# Patient Record
Sex: Female | Born: 1970 | Race: White | Hispanic: No | Smoking: Never smoker
Health system: Southern US, Community
[De-identification: ages and names within clinical notes are randomized; demographics above are authoritative.]

## PROBLEM LIST (undated history)

## (undated) DIAGNOSIS — Z9889 Other specified postprocedural states: Secondary | ICD-10-CM

## (undated) DIAGNOSIS — R112 Nausea with vomiting, unspecified: Secondary | ICD-10-CM

## (undated) DIAGNOSIS — M1990 Unspecified osteoarthritis, unspecified site: Secondary | ICD-10-CM

## (undated) HISTORY — PX: ABDOMINAL HYSTERECTOMY: SHX81

---

## 2008-03-19 HISTORY — PX: EYE SURGERY: SHX253

## 2012-11-12 ENCOUNTER — Encounter: Payer: Self-pay | Admitting: Family Medicine

## 2012-11-12 ENCOUNTER — Ambulatory Visit (INDEPENDENT_AMBULATORY_CARE_PROVIDER_SITE_OTHER): Payer: Managed Care, Other (non HMO) | Admitting: Family Medicine

## 2012-11-12 VITALS — BP 110/68 | Ht 65.0 in | Wt 133.8 lb

## 2012-11-12 DIAGNOSIS — Z0000 Encounter for general adult medical examination without abnormal findings: Secondary | ICD-10-CM

## 2012-11-12 DIAGNOSIS — E785 Hyperlipidemia, unspecified: Secondary | ICD-10-CM | POA: Insufficient documentation

## 2012-11-12 DIAGNOSIS — F411 Generalized anxiety disorder: Secondary | ICD-10-CM

## 2012-11-12 NOTE — Addendum Note (Signed)
Addended by: Donna Bernard on: 11/12/2012 02:26 PM   Modules accepted: Level of Service

## 2012-11-12 NOTE — Progress Notes (Signed)
  Subjective:    Patient ID: [name], female    DOB: 1970/11/28, 42 y.o.   MRN: [id number]  HPI Patient arrives needing blood  Was on nxiety med--lexapro--and got off it. Claims no overt depression.  Will develop rapid heart rate. Usually associated with anxiety.  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 No current problems or concerns. History of mild elevation of cholesterol, however HDL is good. Admits that her diet is not the best.  Review of Systems ROS otherwise negative    Objective:   Physical Exam Alert no acute distress. HEENT normal. Lungs clear. Heart regular rate and rhythm. Abdomen benign.       Assessment & Plan:  Impression 1 tachyarrhythmia associated with anxiety no need for major workup at this time discussed. #2 hyperlipidemia. Plan diet exercise discussed. Appropriate blood work patient needs ASAP in order to qualify for insurance discount. WSL

## 2012-11-13 LAB — LIPID PANEL
Cholesterol: 200 mg/dL (ref 0–200)
HDL: 56 mg/dL (ref >39)
Total CHOL/HDL Ratio: 3.6 Ratio

## 2012-11-13 LAB — GLUCOSE, RANDOM: Glucose, Bld: 83 mg/dL (ref 70–99)

## 2015-07-27 ENCOUNTER — Ambulatory Visit (INDEPENDENT_AMBULATORY_CARE_PROVIDER_SITE_OTHER): Payer: Managed Care, Other (non HMO) | Admitting: Family Medicine

## 2015-07-27 ENCOUNTER — Encounter: Payer: Self-pay | Admitting: Family Medicine

## 2015-07-27 VITALS — BP 108/74 | Ht 65.0 in | Wt 131.0 lb

## 2015-07-27 DIAGNOSIS — E785 Hyperlipidemia, unspecified: Secondary | ICD-10-CM

## 2015-07-27 DIAGNOSIS — Z1231 Encounter for screening mammogram for malignant neoplasm of breast: Secondary | ICD-10-CM

## 2015-07-27 DIAGNOSIS — M25551 Pain in right hip: Secondary | ICD-10-CM | POA: Diagnosis not present

## 2015-07-27 DIAGNOSIS — Z139 Encounter for screening, unspecified: Secondary | ICD-10-CM

## 2015-07-27 MED ORDER — ETODOLAC 400 MG PO TABS: 400.0000 mg | ORAL_TABLET | Freq: Two times a day (BID) | ORAL | Status: DC

## 2015-07-27 NOTE — Progress Notes (Signed)
   Subjective:    Patient ID: Theresa Nelson, female    DOB: 07/22/1970, 45 y.o.   MRN: [id number] Patient arrives after a several year hiatus with multiple Hyperlipidemia This is a chronic problem. The current episode started more than 1 year ago. Compliance problems include adherence to exercise and adherence to diet.   exercising some, having somde trouble with the food, walking all day,   Requesting bloodwork and screening mammo. Pt states she has never had a mammogram. No fam hx of breast cancer  Pos fam hx of prost ca,  Some diab in the family, mo has high b p  Some    Right hip pain. Started a few months ago. Pain radiates down right leg. Not able to sleep well due to pain. Worse when sitting. Takes aleve or advil as needed. Feels a catch in it at times, last six to eight months , catching sensation, worse with sitting  Not a big veggie person , some fruits  Goes to dr Despina [facility], couple yrs ago, so on a protracted visit plan   Rapid heart rate spells occas, last five or ten mnutes, feels like anxiety and jittery . No major chest discomfort with nausea no diaphoresis   Review of Systems No headache, no major weight loss or weight gain, no chest pain no back pain abdominal pain no change in bowel habits complete ROS otherwise negative     Objective:   Physical Exam  Alert vitals stable blood pressure excellent on repeat HEENT normal lungs clear. Heart regular rhythm no tachyarrhythmias auscultated right hip positive pain with internal rotation negative true straight leg raise. Mild tenderness lateral trochanter with palpation No obvious side effects sugars no spinal tenderness      Assessment & Plan:  Impression 1 subacute hip pain exam points more towards something outside joint like bursitis, doubt sciatica" though always possibility #2 hyperlipidemia status uncertain 3 tachyarrhythmias ongoing not to 4  Proper screening was never had a mammogram and 25 minutes spent most in  discussion. Trial of anti-inflammatory. Hip x-ray. Appropriate blood work. Diet exercise discussed mammogram scheduled

## 2015-07-28 ENCOUNTER — Ambulatory Visit (HOSPITAL_COMMUNITY): Payer: Managed Care, Other (non HMO)

## 2015-07-28 ENCOUNTER — Ambulatory Visit (HOSPITAL_COMMUNITY)
Admission: RE | Admit: 2015-07-28 | Discharge: 2015-07-28 | Disposition: A | Discharge status: Home / Self Care | Payer: Managed Care, Other (non HMO) | Source: Ambulatory Visit | Attending: Family Medicine | Admitting: Family Medicine

## 2015-07-28 ENCOUNTER — Other Ambulatory Visit: Payer: Self-pay | Admitting: Family Medicine

## 2015-07-28 DIAGNOSIS — R928 Other abnormal and inconclusive findings on diagnostic imaging of breast: Secondary | ICD-10-CM | POA: Insufficient documentation

## 2015-07-28 DIAGNOSIS — Z1231 Encounter for screening mammogram for malignant neoplasm of breast: Secondary | ICD-10-CM | POA: Diagnosis not present

## 2015-07-28 DIAGNOSIS — M1611 Unilateral primary osteoarthritis, right hip: Secondary | ICD-10-CM | POA: Diagnosis not present

## 2015-07-28 DIAGNOSIS — M25551 Pain in right hip: Secondary | ICD-10-CM | POA: Insufficient documentation

## 2015-07-28 LAB — HEPATIC FUNCTION PANEL
ALT: 10 IU/L (ref 0–32)
AST: 13 IU/L (ref 0–40)
Albumin: 4.8 g/dL (ref 3.5–5.5)
Alkaline Phosphatase: 79 IU/L (ref 39–117)
Bilirubin Total: 0.4 mg/dL (ref 0.0–1.2)
Bilirubin, Direct: 0.09 mg/dL (ref 0.00–0.40)
Total Protein: 7.5 g/dL (ref 6.0–8.5)

## 2015-07-28 LAB — LIPID PANEL
CHOL/HDL RATIO: 3.9 ratio (ref 0.0–4.4)
Cholesterol, Total: 221 mg/dL — ABNORMAL HIGH (ref 100–199)
HDL: 56 mg/dL (ref >39)
LDL CALC: 128 mg/dL — AB (ref 0–99)
TRIGLYCERIDES: 186 mg/dL — AB (ref 0–149)
VLDL CHOLESTEROL CAL: 37 mg/dL (ref 5–40)

## 2015-07-28 LAB — BASIC METABOLIC PANEL
BUN/Creatinine Ratio: 20 (ref 9–23)
BUN: 13 mg/dL (ref 6–24)
CO2: 23 mmol/L (ref 18–29)
CREATININE: 0.64 mg/dL (ref 0.57–1.00)
Calcium: 9.5 mg/dL (ref 8.7–10.2)
Chloride: 100 mmol/L (ref 96–106)
GFR calc Af Amer: 125 mL/min/{1.73_m2} (ref >59)
GFR calc non Af Amer: 108 mL/min/{1.73_m2} (ref >59)
GLUCOSE: 94 mg/dL (ref 65–99)
POTASSIUM: 4.5 mmol/L (ref 3.5–5.2)
SODIUM: 141 mmol/L (ref 134–144)

## 2015-07-29 NOTE — Addendum Note (Signed)
Addended by: Jeralene PetersREWS, SHANNON R on: 07/29/2015 09:17 AM   Modules accepted: Orders

## 2015-08-01 ENCOUNTER — Other Ambulatory Visit: Payer: Self-pay | Admitting: Family Medicine

## 2015-08-01 DIAGNOSIS — R928 Other abnormal and inconclusive findings on diagnostic imaging of breast: Secondary | ICD-10-CM

## 2015-08-08 ENCOUNTER — Other Ambulatory Visit (HOSPITAL_COMMUNITY): Payer: Self-pay | Admitting: Sports Medicine

## 2015-08-16 ENCOUNTER — Encounter (HOSPITAL_COMMUNITY): Payer: Managed Care, Other (non HMO)

## 2015-08-16 ENCOUNTER — Ambulatory Visit (HOSPITAL_COMMUNITY)
Admission: RE | Admit: 2015-08-16 | Discharge: 2015-08-16 | Disposition: A | Discharge status: Home / Self Care | Payer: Managed Care, Other (non HMO) | Source: Ambulatory Visit | Attending: Family Medicine | Admitting: Family Medicine

## 2015-08-16 ENCOUNTER — Other Ambulatory Visit: Payer: Self-pay | Admitting: Family Medicine

## 2015-08-16 DIAGNOSIS — R928 Other abnormal and inconclusive findings on diagnostic imaging of breast: Secondary | ICD-10-CM | POA: Insufficient documentation

## 2017-02-13 IMAGING — DX DG HIP (WITH OR WITHOUT PELVIS) 2-3V*R*
3 series · 3 of 3 positions shown · non-contrast
Comparison: None.

CLINICAL DATA: Right hip pain radiating to the knee, no injury

EXAM:
DG HIP (WITH OR WITHOUT PELVIS) 2-3V RIGHT

[pelvis ap]
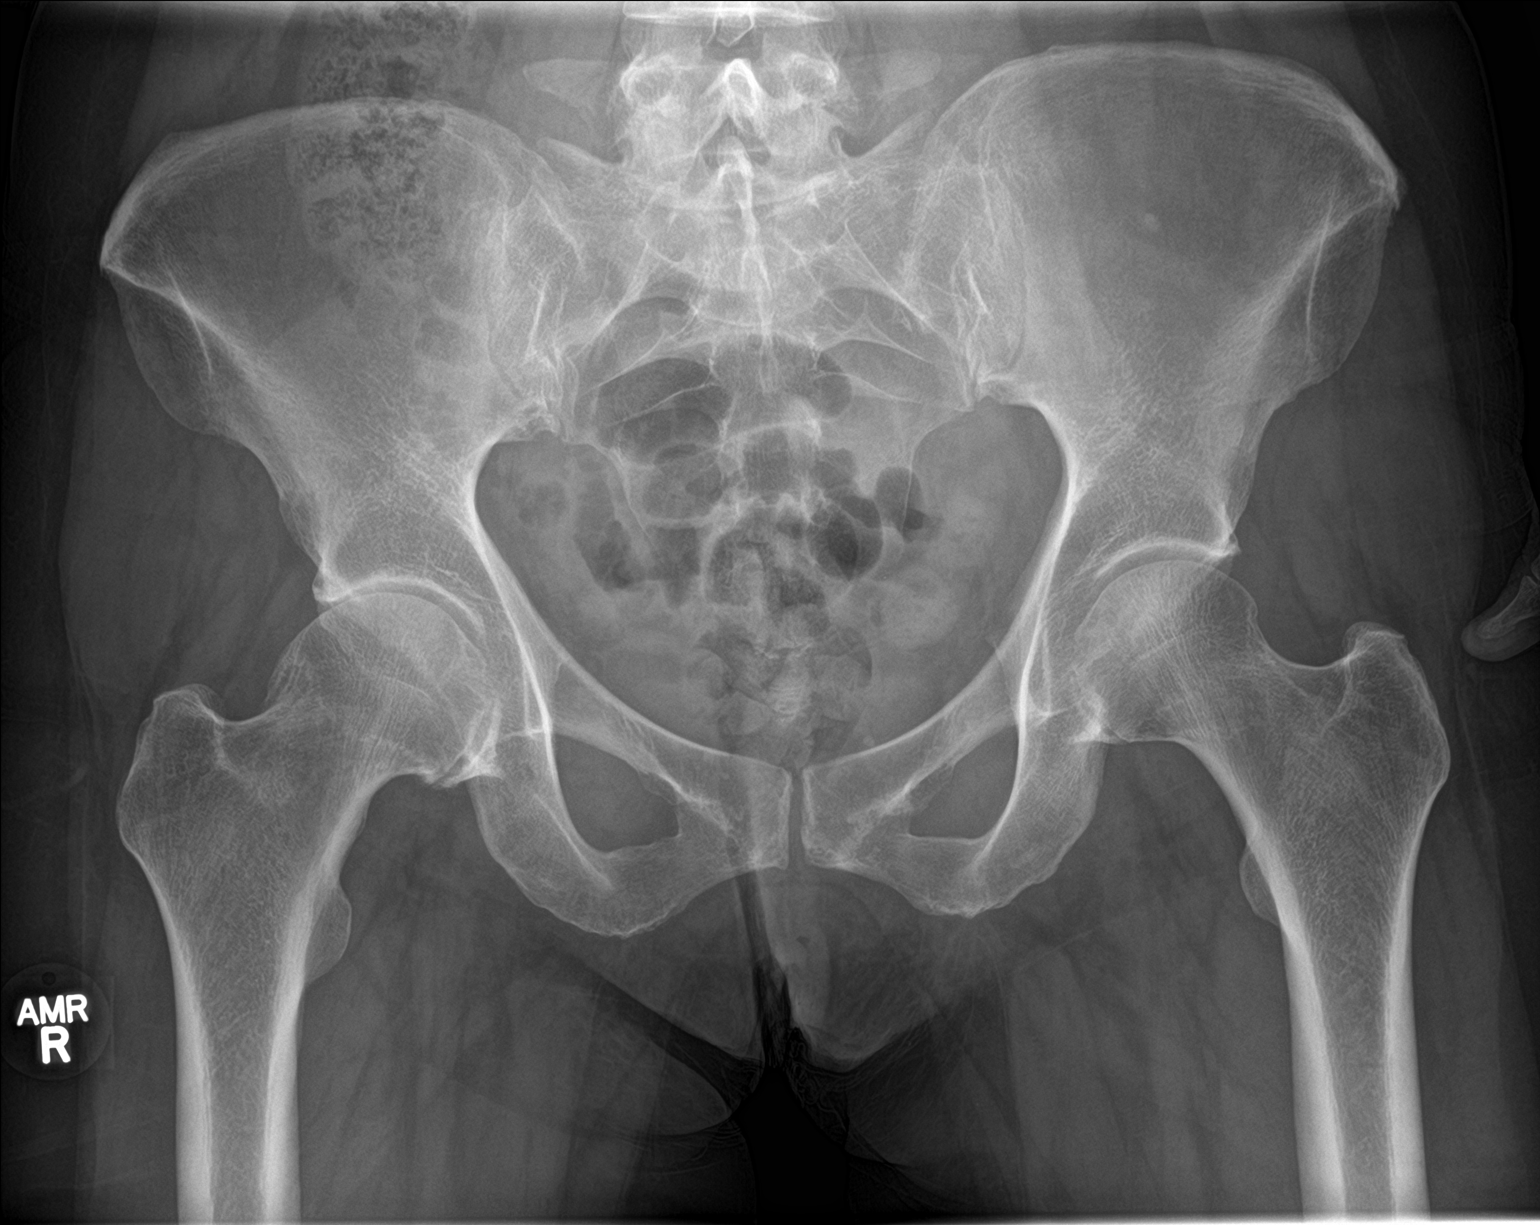

[hip ap]
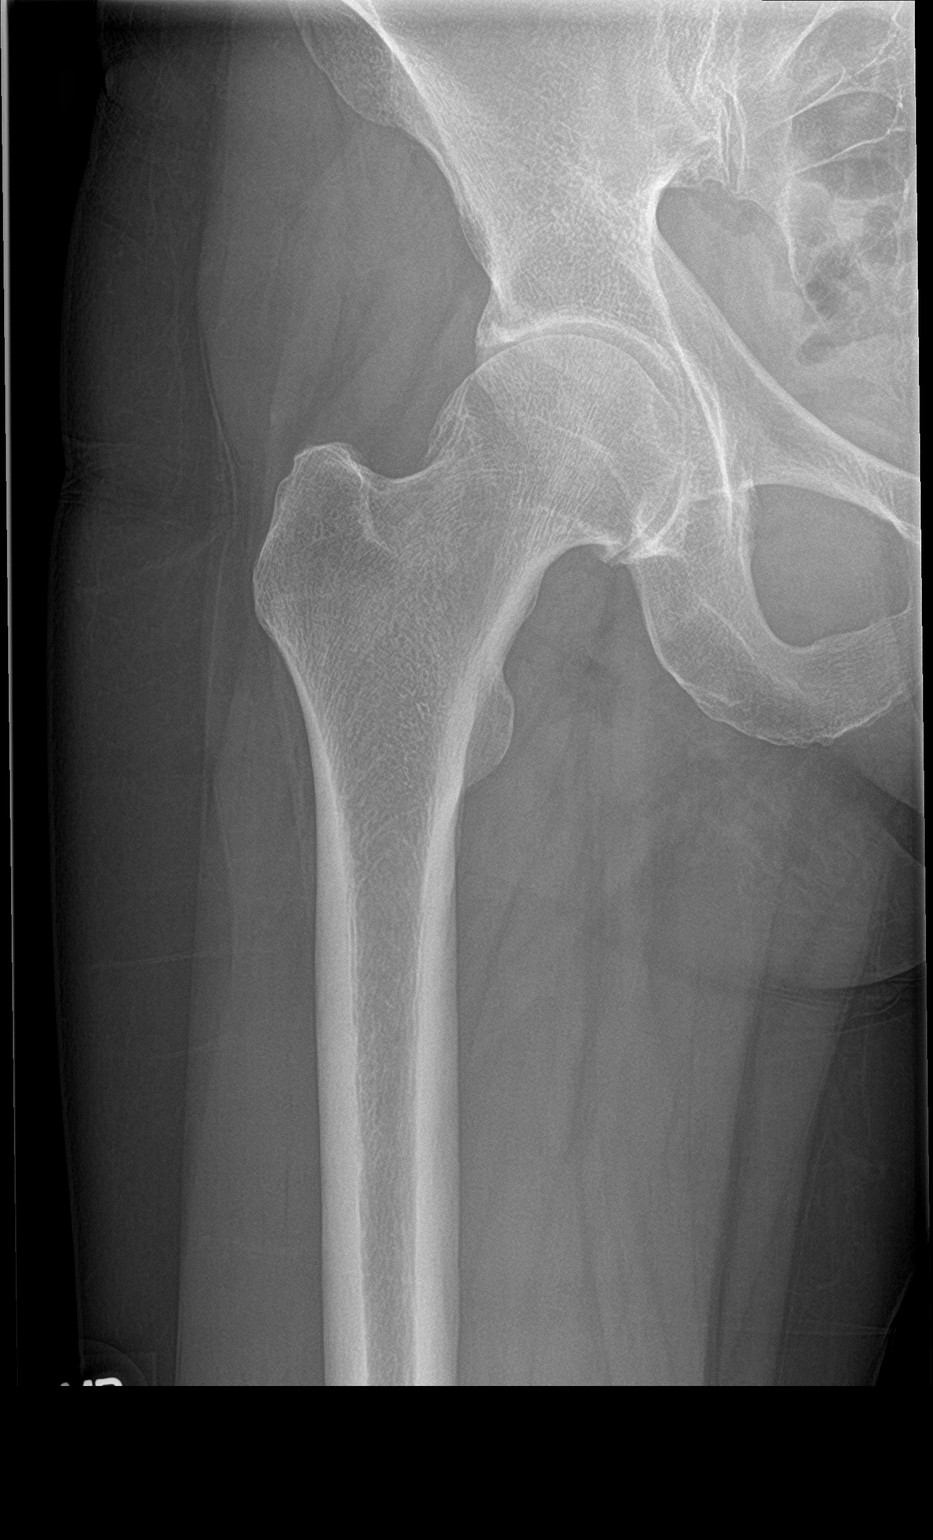

[hip lat]
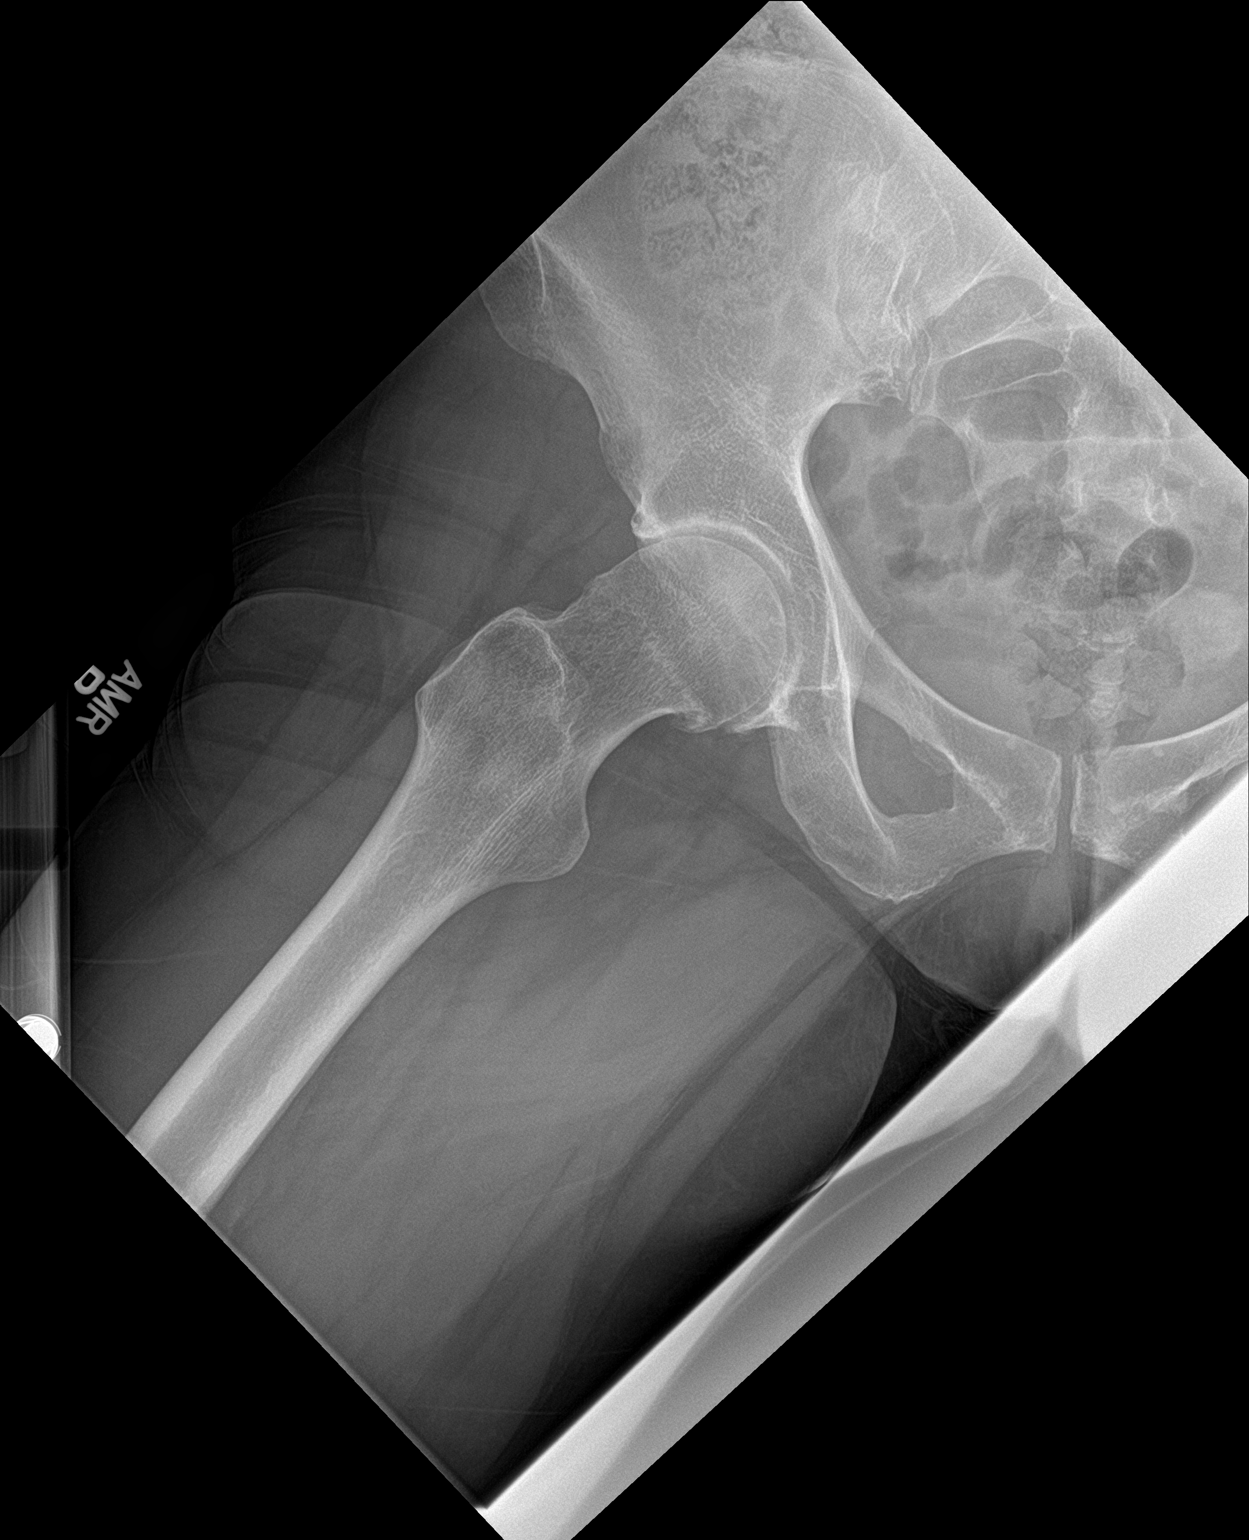

[3 of 3 positions shown; findings below may reference images not displayed]

FINDINGS: There is age advanced degenerative joint disease primarily involving
the right hip where there is more loss of joint space and sclerosis
with spurring. Lesser degenerative change is noted in the left hip.
The pelvic rami are intact. The SI joints are corticated.
IMPRESSION: Age advanced moderate degenerative joint disease of the right hip.

## 2017-05-31 ENCOUNTER — Ambulatory Visit (INDEPENDENT_AMBULATORY_CARE_PROVIDER_SITE_OTHER): Payer: BLUE CROSS/BLUE SHIELD | Admitting: Family Medicine

## 2017-05-31 VITALS — BP 112/72 | Wt 132.6 lb

## 2017-05-31 DIAGNOSIS — R300 Dysuria: Secondary | ICD-10-CM

## 2017-05-31 DIAGNOSIS — M545 Low back pain, unspecified: Secondary | ICD-10-CM

## 2017-05-31 LAB — POCT URINALYSIS DIPSTICK
Spec Grav, UA: 1.015 (ref 1.010–1.025)
pH, UA: 6 (ref 5.0–8.0)

## 2017-05-31 MED ORDER — DICLOFENAC SODIUM 75 MG PO TBEC: 75.0000 mg | DELAYED_RELEASE_TABLET | Freq: Two times a day (BID) | ORAL | 0 refills | Status: DC

## 2017-05-31 NOTE — Progress Notes (Signed)
   Subjective:    Patient ID: Theresa Nelson, female    DOB: 11-01-1970, 47 y.o.   MRN: [id number]  HPI Patient arrives with back pain on her left side for a few days.  She describes this as left lower back pain discomfort is constant is made it difficult for her to sleep at night to get a comfortable position it does not radiate down the leg she states she [place] walking she has arthritis in her right hip she also relates that when she was driving she noticed that as well she denies fever sweats chills weight loss.  She denies abdominal pain dysuria urinary frequency denies flank pain denies hematuria denies rectal bleeding.  States appetite is been good denies being depressed. On physical exam left lower back moderate tenderness increased pain with extension and with deep flexion. Patient would also like to be checked for sinus infection. Patient relates head congestion drainage over the past couple days no wheezing or difficulty breathing Review of Systems  Constitutional: Negative for activity change and appetite change.  HENT: Negative for congestion.   Respiratory: Negative for cough.   Cardiovascular: Negative for chest pain.  Gastrointestinal: Negative for abdominal pain and vomiting.  Skin: Negative for color change.  Neurological: Negative for weakness.  Psychiatric/Behavioral: Negative for confusion.       Objective:   Physical Exam  Constitutional: She appears well-developed and well-nourished. No distress.  HENT:  Head: Normocephalic and atraumatic.  Eyes: Right eye exhibits no discharge. Left eye exhibits no discharge.  Neck: No tracheal deviation present.  Cardiovascular: Normal rate, regular rhythm and normal heart sounds.  No murmur heard. Pulmonary/Chest: Effort normal and breath sounds normal. No respiratory distress. She has no wheezes. She has no rales.  Musculoskeletal: She exhibits no edema.  Lymphadenopathy:    She  has no cervical adenopathy.  Neurological: She is alert. She exhibits normal muscle tone.  Skin: Skin is warm and dry. No erythema.  Psychiatric: Her behavior is normal.  Vitals reviewed.  Negative straight leg raise strength overall good.  Abdomen is soft there is no guarding rebound or tenderness no flank discomfort to percussion urinalysis negative       Assessment & Plan:  Probable sacroiliac joint inflammation.  Concerning for the possibility of underlying issue if this persists.  I recommend anti-inflammatory over the course of the next 10 days then as needed thereafter also recommend stretching exercises printout was given to the patient  If the patient does not have significant improvement over the course of the next 10 days she needs to let us know we will help set her up for x-rays of this area and possible orthopedic consultation.  Certainly with this patient's history of arthritis in the right hip there is some possibility of underlying arthritic issue causing joint dysfunction I find no evidence of any other type of intra-abdominal pathology going on  Viral sinus illness going on should gradually get better over the next several days if not call us back for potential medications

## 2017-12-27 ENCOUNTER — Ambulatory Visit (INDEPENDENT_AMBULATORY_CARE_PROVIDER_SITE_OTHER): Payer: BLUE CROSS/BLUE SHIELD | Admitting: Family Medicine

## 2017-12-27 ENCOUNTER — Encounter: Payer: Self-pay | Admitting: Family Medicine

## 2017-12-27 VITALS — BP 110/72 | Temp 98.2°F | Ht 65.0 in | Wt 132.6 lb

## 2017-12-27 DIAGNOSIS — J329 Chronic sinusitis, unspecified: Secondary | ICD-10-CM

## 2017-12-27 MED ORDER — CEFPROZIL 500 MG PO TABS: 500.0000 mg | ORAL_TABLET | Freq: Two times a day (BID) | ORAL | 0 refills | Status: DC

## 2017-12-27 MED ORDER — DICLOFENAC SODIUM 75 MG PO TBEC: 75.0000 mg | DELAYED_RELEASE_TABLET | Freq: Two times a day (BID) | ORAL | 1 refills | Status: DC

## 2017-12-27 NOTE — Progress Notes (Signed)
   Subjective:    Patient ID: Theresa Nelson, female    DOB: 1970/03/28, 47 y.o.   MRN: [id number]  Cough  This is a new problem. The current episode started in the past 7 days. Associated symptoms include headaches, nasal congestion, a sore throat and wheezing. She has tried OTC cough suppressant for the symptoms.   Positive pain with cooughing    Some productive cough  Dim energy   Tickle and croupy cough    'no fever    Frontal headache  No nose running '  Os wheeziness in the chest at times, worse withdeep breath        Review of Systems  HENT: Positive for sore throat.   Respiratory: Positive for cough and wheezing.   Neurological: Positive for headaches.       Objective:   Physical Exam  Alert, mild malaise. Hydration good Vitals stable. frontal/ maxillary tenderness evident positive nasal congestion. pharynx normal neck supple  lungs clear/no crackles or wheezes. heart regular in rhythm       Assessment & Plan:  Impression rhinosinusitis/bronchitis likely post viral, discussed with patient. plan antibiotics prescribed. Questions answered. Symptomatic care discussed. warning signs discussed. WSL

## 2018-05-20 ENCOUNTER — Other Ambulatory Visit: Payer: Self-pay | Admitting: Family Medicine

## 2018-06-16 ENCOUNTER — Other Ambulatory Visit: Payer: Self-pay | Admitting: Family Medicine

## 2018-10-05 ENCOUNTER — Other Ambulatory Visit: Payer: Self-pay | Admitting: Family Medicine

## 2019-01-19 ENCOUNTER — Other Ambulatory Visit: Payer: Self-pay | Admitting: Family Medicine

## 2019-02-17 ENCOUNTER — Other Ambulatory Visit: Payer: Self-pay

## 2019-02-17 ENCOUNTER — Ambulatory Visit (INDEPENDENT_AMBULATORY_CARE_PROVIDER_SITE_OTHER): Payer: BC Managed Care – PPO | Admitting: Family Medicine

## 2019-02-17 DIAGNOSIS — Z20828 Contact with and (suspected) exposure to other viral communicable diseases: Secondary | ICD-10-CM

## 2019-02-17 DIAGNOSIS — Z20822 Contact with and (suspected) exposure to covid-19: Secondary | ICD-10-CM

## 2019-02-17 MED ORDER — ONDANSETRON 4 MG PO TBDP: 4.0000 mg | ORAL_TABLET | Freq: Three times a day (TID) | ORAL | 0 refills | Status: DC | PRN

## 2019-02-17 NOTE — Progress Notes (Signed)
   Subjective:  Audio plus video  Patient ID: Theresa Nelson, female    DOB: 19-Nov-1970, 48 y.o.   MRN: [id number]  Cough This is a new problem. The current episode started today. Associated symptoms include headaches.   Patient states she had rapid covid test this am and it came back negative but her fiance tested this am as well and his was positive. Patient states her son in laws test came back positive as well and she kept her grandchild this weekend and was exposed to them.   Review of Systems  Respiratory: Positive for cough.   Neurological: Positive for headaches.   Virtual Visit via Video Note  I connected with Theresa Nelson on 02/17/19 at  3:50 PM EST by a video enabled telemedicine application and verified that I am speaking with the correct person using two identifiers.  Location: Patient: home Provider: office   I discussed the limitations of evaluation and management by telemedicine and the availability of in person appointments. The patient expressed understanding and agreed to proceed.  History of Present Illness:    Observations/Objective:   Assessment and Plan:   Follow Up Instructions:    I discussed the assessment and treatment plan with the patient. The patient was provided an opportunity to ask questions and all were answered. The patient agreed with the plan and demonstrated an understanding of the instructions.   The patient was advised to call back or seek an in-person evaluation if the symptoms worsen or if the condition fails to improve as anticipated.  I provided 17 minutes of non-face-to-face time during this encounter.  Persistent symptoms.  Of note boyfriend tested positive.  For COVID-19.  Patient tested negative.  Cough is productive at times.  Some nasal discharge.  Patient had a test of brain which was negative      Objective:   Physical Exam  Virtual      Assessment & Plan:  Impression probable COVID-19 and section.  Will cover  with antibiotics for secondary bacterial involvement.  Negative test should not be trusted in this situation of close contact to known individual discussion held

## 2019-02-18 ENCOUNTER — Encounter: Payer: Self-pay | Admitting: Family Medicine

## 2019-02-18 ENCOUNTER — Other Ambulatory Visit: Payer: Self-pay | Admitting: *Deleted

## 2019-02-18 ENCOUNTER — Telehealth: Payer: Self-pay | Admitting: Family Medicine

## 2019-02-18 MED ORDER — CEFDINIR 300 MG PO CAPS: 300.0000 mg | ORAL_CAPSULE | Freq: Two times a day (BID) | ORAL | 0 refills | Status: DC

## 2019-02-18 NOTE — Telephone Encounter (Signed)
Pt is having constant left ear throbbing. Tylenol is not helping she is wondering if she could have an ear infection.   [id number] [place], [place] had visit on 02/17/2019

## 2019-02-18 NOTE — Telephone Encounter (Signed)
Had visit yesterday 

## 2019-02-18 NOTE — Telephone Encounter (Signed)
I advised pt yest she almost certainly has covid, could hhave seconary ear infxn omnicef 300 bid ten d

## 2019-02-18 NOTE — Telephone Encounter (Signed)
Med sent to pharm. Left message to return call to discuss message below 

## 2019-02-18 NOTE — Telephone Encounter (Signed)
Left ear pain since last week. Took dayquil and it went away and started back last night. Throbbing pain. Tylenol did help today but not yesterday. Also took a sinus pill. Started having a dry cough yesterday. Nose has felt dry for the past couple of days. 2 days ago woke with a headache and a runny nose. covid test came back negative and she is in quarantine because some of her family members have covid.

## 2019-02-18 NOTE — Telephone Encounter (Signed)
Discussed with pt. Pt verbalized understanding.  °

## 2019-02-22 ENCOUNTER — Encounter: Payer: Self-pay | Admitting: Family Medicine

## 2019-03-02 ENCOUNTER — Ambulatory Visit (INDEPENDENT_AMBULATORY_CARE_PROVIDER_SITE_OTHER): Payer: BC Managed Care – PPO | Admitting: Family Medicine

## 2019-03-02 ENCOUNTER — Other Ambulatory Visit: Payer: Self-pay

## 2019-03-02 DIAGNOSIS — Z20822 Contact with and (suspected) exposure to covid-19: Secondary | ICD-10-CM

## 2019-03-02 DIAGNOSIS — Z20828 Contact with and (suspected) exposure to other viral communicable diseases: Secondary | ICD-10-CM | POA: Diagnosis not present

## 2019-03-02 NOTE — Progress Notes (Signed)
   Subjective:    Patient ID: Theresa Nelson, female    DOB: January 09, 1971, 48 y.o.   MRN: [id number]  [place] having headache, sinus issues, ear pain, nausea and bodyaches. covid test was negative but pt states everybody in family was positive for covid. Pt states she felt better but yesterday started having burning in chest and this morning woke up feeling a tightness in her chest. No cough, no sob, no fever.   Virtual Visit via Video Note  I connected with Theresa Nelson on 03/02/19 at  9:30 AM EST by a video enabled telemedicine application and verified that I am speaking with the correct person using two identifiers.  Location: Patient: home Provider: office   I discussed the limitations of evaluation and management by telemedicine and the availability of in person appointments. The patient expressed understanding and agreed to proceed.  History of Present Illness:    Observations/Objective:   Assessment and Plan:   Follow Up Instructions:    I discussed the assessment and treatment plan with the patient. The patient was provided an opportunity to ask questions and all were answered. The patient agreed with the plan and demonstrated an understanding of the instructions.   The patient was advised to call back or seek an in-person evaluation if the symptoms worsen or if the condition fails to improve as anticipated.  I provided 18 minutes of non-face-to-face time during this encounter.  Everyone within patient's immediate family tested positive for COVID-19.  Patient had rapid testing and was told negative.  Yet she had all the classic symptoms.  Took a round of antibiotics for potential sinusitis  Ms. continue to have intermittent headaches.  The last day has developed achy discomfort in the chest.  Not using her anti-inflammatory medications prescribed in the past.  Non-smoker.  No true wheezing.  Able to speak in full sentences  Review of Systems See above    Objective:   Physical Exam  Virtual      Assessment & Plan:  Impression probable lingering symptoms from true COVID-19 infection.  Discussed.  See no reason for inhaler or antibiotics at this time.  Please resume prescription anti-inflammatory would likely help the chest symptoms.  Pulse oximeter recommended patient purchase "monitor oxygen work excuse through this week extended

## 2019-03-25 ENCOUNTER — Other Ambulatory Visit: Payer: Self-pay

## 2019-03-25 ENCOUNTER — Ambulatory Visit (INDEPENDENT_AMBULATORY_CARE_PROVIDER_SITE_OTHER): Payer: BC Managed Care – PPO | Admitting: Family Medicine

## 2019-03-25 DIAGNOSIS — S39012A Strain of muscle, fascia and tendon of lower back, initial encounter: Secondary | ICD-10-CM

## 2019-03-25 DIAGNOSIS — M545 Low back pain: Secondary | ICD-10-CM

## 2019-03-25 MED ORDER — DICLOFENAC SODIUM 75 MG PO TBEC: 75.0000 mg | DELAYED_RELEASE_TABLET | Freq: Two times a day (BID) | ORAL | 3 refills | Status: DC

## 2019-03-25 MED ORDER — TIZANIDINE HCL 4 MG PO TABS: 4.0000 mg | ORAL_TABLET | Freq: Three times a day (TID) | ORAL | 2 refills | Status: DC | PRN

## 2019-03-25 NOTE — Progress Notes (Signed)
   Subjective:  Audio plus video  Patient ID: Theresa Nelson, female    DOB: [birth year], 49 y.o.   MRN: [id number]  Back Pain This is a new problem. Episode onset: 5 days. The pain is present in the lumbar spine. The symptoms are aggravated by bending (getting up from sitting down). She has tried heat for the symptoms.   Virtual Visit via Video Note  I connected with Theresa Nelson on 03/25/19 at  2:30 PM EST by a video enabled telemedicine application and verified that I am speaking with the correct person using two identifiers.  Location: Patient: home Provider: office   I discussed the limitations of evaluation and management by telemedicine and the availability of in person appointments. The patient expressed understanding and agreed to proceed.  History of Present Illness:    Observations/Objective:   Assessment and Plan:   Follow Up Instructions:    I discussed the assessment and treatment plan with the patient. The patient was provided an opportunity to ask questions and all were answered. The patient agreed with the plan and demonstrated an understanding of the instructions.   The patient was advised to call back or seek an in-person evaluation if the symptoms worsen or if the condition fails to improve as anticipated.  I provided 18 minutes of non-face-to-face time during this encounter.   Patient has been out sick with COVID-19.  Recently returned to work.  Does do some heavy lifting at times.  Low back is very painful aching.  Minimal radiation in the legs.  Occasional sharp grabbing pains    Review of Systems  Musculoskeletal: Positive for back pain.   No headache no chest pain no shortness of breath    Objective:   Physical Exam   Virtual     Assessment & Plan:  Impression lumbar strain.  Symptom care discussed warning signs discussed.  Medications prescribed.  No x-rays rationale discussed.  Exercises encouraged long-term

## 2019-04-22 ENCOUNTER — Encounter: Payer: Self-pay | Admitting: Family Medicine

## 2019-08-27 ENCOUNTER — Other Ambulatory Visit: Payer: Self-pay | Admitting: Family Medicine

## 2019-12-28 ENCOUNTER — Ambulatory Visit (INDEPENDENT_AMBULATORY_CARE_PROVIDER_SITE_OTHER): Payer: BC Managed Care – PPO | Admitting: Family Medicine

## 2019-12-28 ENCOUNTER — Encounter: Payer: Self-pay | Admitting: Family Medicine

## 2019-12-28 VITALS — BP 114/72 | HR 77 | Temp 97.7°F | Ht 65.5 in | Wt 126.8 lb

## 2019-12-28 DIAGNOSIS — E785 Hyperlipidemia, unspecified: Secondary | ICD-10-CM

## 2019-12-28 DIAGNOSIS — H5789 Other specified disorders of eye and adnexa: Secondary | ICD-10-CM | POA: Insufficient documentation

## 2019-12-28 DIAGNOSIS — Z9889 Other specified postprocedural states: Secondary | ICD-10-CM

## 2019-12-28 DIAGNOSIS — M1611 Unilateral primary osteoarthritis, right hip: Secondary | ICD-10-CM | POA: Insufficient documentation

## 2019-12-28 DIAGNOSIS — Z0000 Encounter for general adult medical examination without abnormal findings: Secondary | ICD-10-CM

## 2019-12-28 DIAGNOSIS — M1612 Unilateral primary osteoarthritis, left hip: Secondary | ICD-10-CM | POA: Diagnosis not present

## 2019-12-28 MED ORDER — DICLOFENAC SODIUM 75 MG PO TBEC: 75.0000 mg | DELAYED_RELEASE_TABLET | Freq: Two times a day (BID) | ORAL | 3 refills | Status: DC

## 2019-12-28 MED ORDER — TIZANIDINE HCL 4 MG PO TABS: 4.0000 mg | ORAL_TABLET | Freq: Three times a day (TID) | ORAL | 2 refills | Status: DC | PRN

## 2019-12-28 NOTE — Progress Notes (Signed)
Patient ID: [name], female    DOB: 1970/07/30, 49 y.o.   MRN: [id number]   Chief Complaint  Patient presents with  . Arthritis    follow up- would like to have some routine labs   Subjective:    HPI Pt had h/o arthritis in rt hip. Xray in 2017- showing advanced moderate DJD rt hp. Doing lots of lifting at work.  Using diclofenac and zanaflex.  Hard to sleep, laying on left side. Worse when it rains. Hasn't seen ortho for it. One suggested hip replaced and other was okay to wait.  Had covid in 11/20.  Severe ha, fatigue, lasting a few weeks. Nausea.  Lost taste/smell. Resolved on it's own.  HM- father with colon cancer. around late 17s. Had cirrhosis from alcohol abuse. Mother- copd.  Medical History Kathlene has no past medical history on file.   Outpatient Encounter Medications as of 12/28/2019  Medication Sig  . diclofenac (VOLTAREN) 75 MG EC tablet Take 1 tablet (75 mg total) by mouth 2 (two) times daily.  Marland Kitchen tiZANidine (ZANAFLEX) 4 MG tablet Take 1 tablet (4 mg total) by mouth every 8 (eight) hours as needed for muscle spasms.  . [DISCONTINUED] diclofenac (VOLTAREN) 75 MG EC tablet Take 1 tablet (75 mg total) by mouth 2 (two) times daily.  . [DISCONTINUED] tiZANidine (ZANAFLEX) 4 MG tablet Take 1 tablet (4 mg total) by mouth every 8 (eight) hours as needed for muscle spasms.   No facility-administered encounter medications on file as of 12/28/2019.     Review of Systems  Constitutional: Negative for chills and fever.  HENT: Negative for congestion, rhinorrhea and sore throat.   Respiratory: Negative for cough, shortness of breath and wheezing.   Cardiovascular: Negative for chest pain and leg swelling.  Gastrointestinal: Negative for abdominal pain, diarrhea, nausea and vomiting.  Genitourinary: Negative for dysuria and frequency.  Musculoskeletal: Positive for arthralgias (left hip). Negative for back pain.  Skin: Negative for rash.  Neurological: Negative  for dizziness, weakness and headaches.     Vitals BP 114/72   Pulse 77   Temp 97.7 F (36.5 C) (Oral)   Ht 5' 5.5" (1.664 m)   Wt 126 lb 12.8 oz (57.5 kg)   SpO2 99%   BMI 20.78 kg/m   Objective:   Physical Exam Vitals and nursing note reviewed.  Constitutional:      Appearance: Normal appearance.  HENT:     Head: Normocephalic and atraumatic.  Eyes:     Extraocular Movements: Extraocular movements intact.     Conjunctiva/sclera: Conjunctivae normal.     Pupils: Pupils are equal, round, and reactive to light.  Cardiovascular:     Rate and Rhythm: Normal rate and regular rhythm.     Pulses: Normal pulses.     Heart sounds: Normal heart sounds.  Pulmonary:     Effort: Pulmonary effort is normal.     Breath sounds: Normal breath sounds. No wheezing, rhonchi or rales.  Musculoskeletal:        General: Tenderness (left hip, dec rom due to pain) present.     Right lower leg: No edema.     Left lower leg: No edema.  Skin:    General: Skin is warm and dry.     Findings: No lesion or rash.  Neurological:     General: No focal deficit present.     Mental Status: She is alert and oriented to person, place, and time.  Psychiatric:  Mood and Affect: Mood normal.        Behavior: Behavior normal.        Thought Content: Thought content normal.        Judgment: Judgment normal.      Assessment and Plan   1. Arthritis of left hip  2. Hyperlipidemia, unspecified hyperlipidemia type  3. Laboratory tests ordered as part of a complete physical exam (CPE) - CMP14+EGFR - CBC - Lipid panel - TSH  4. History of eye surgery   Left hip arthritis- cont diclofenac and zanaflex prn.  Seeing gyn -had hysterectomy not needing to get pap, per pt.  HM- pt wanting to wait till turning 49 yrs old for her colonoscopy.  Left hip pain- controlled, stable, with diclofenac and zanaflex.  Pt not ready for surgery at this time.  F/u [place] prn.

## 2019-12-29 LAB — LIPID PANEL
Chol/HDL Ratio: 4.2 ratio (ref 0.0–4.4)
Cholesterol, Total: 241 mg/dL — ABNORMAL HIGH (ref 100–199)
HDL: 58 mg/dL (ref >39)
LDL Chol Calc (NIH): 161 mg/dL — ABNORMAL HIGH (ref 0–99)
Triglycerides: 122 mg/dL (ref 0–149)
VLDL Cholesterol Cal: 22 mg/dL (ref 5–40)

## 2019-12-29 LAB — CMP14+EGFR
ALT: 17 IU/L (ref 0–32)
AST: 17 IU/L (ref 0–40)
Albumin/Globulin Ratio: 2.2 (ref 1.2–2.2)
Albumin: 4.8 g/dL (ref 3.8–4.8)
Alkaline Phosphatase: 86 IU/L (ref 44–121)
BUN/Creatinine Ratio: 24 — ABNORMAL HIGH (ref 9–23)
BUN: 16 mg/dL (ref 6–24)
Bilirubin Total: 0.4 mg/dL (ref 0.0–1.2)
CO2: 25 mmol/L (ref 20–29)
Calcium: 9.4 mg/dL (ref 8.7–10.2)
Chloride: 100 mmol/L (ref 96–106)
Creatinine, Ser: 0.66 mg/dL (ref 0.57–1.00)
GFR calc Af Amer: 120 mL/min/{1.73_m2} (ref >59)
GFR calc non Af Amer: 104 mL/min/{1.73_m2} (ref >59)
Globulin, Total: 2.2 g/dL (ref 1.5–4.5)
Glucose: 85 mg/dL (ref 65–99)
Potassium: 4.3 mmol/L (ref 3.5–5.2)
Sodium: 139 mmol/L (ref 134–144)
Total Protein: 7 g/dL (ref 6.0–8.5)

## 2019-12-29 LAB — CBC
Hematocrit: 41.4 % (ref 34.0–46.6)
Hemoglobin: 14 g/dL (ref 11.1–15.9)
MCH: 30.2 pg (ref 26.6–33.0)
MCHC: 33.8 g/dL (ref 31.5–35.7)
MCV: 89 fL (ref 79–97)
Platelets: 342 10*3/uL (ref 150–450)
RBC: 4.63 x10E6/uL (ref 3.77–5.28)
RDW: 11.8 % (ref 11.7–15.4)
WBC: 8.3 10*3/uL (ref 3.4–10.8)

## 2019-12-29 LAB — TSH: TSH: 1.67 u[IU]/mL (ref 0.450–4.500)

## 2020-05-11 ENCOUNTER — Other Ambulatory Visit: Payer: Self-pay | Admitting: Family Medicine

## 2020-06-27 ENCOUNTER — Encounter: Payer: Self-pay | Admitting: Family Medicine

## 2020-06-27 ENCOUNTER — Ambulatory Visit (INDEPENDENT_AMBULATORY_CARE_PROVIDER_SITE_OTHER): Payer: BC Managed Care – PPO | Admitting: Family Medicine

## 2020-06-27 ENCOUNTER — Other Ambulatory Visit: Payer: Self-pay

## 2020-06-27 VITALS — BP 122/68 | HR 92 | Temp 97.2°F | Ht 65.5 in | Wt 128.8 lb

## 2020-06-27 DIAGNOSIS — M1611 Unilateral primary osteoarthritis, right hip: Secondary | ICD-10-CM | POA: Diagnosis not present

## 2020-06-27 DIAGNOSIS — M5432 Sciatica, left side: Secondary | ICD-10-CM

## 2020-06-27 MED ORDER — DICLOFENAC SODIUM 75 MG PO TBEC: 75.0000 mg | DELAYED_RELEASE_TABLET | Freq: Two times a day (BID) | ORAL | 1 refills | Status: DC

## 2020-06-27 MED ORDER — TIZANIDINE HCL 4 MG PO TABS: 4.0000 mg | ORAL_TABLET | Freq: Three times a day (TID) | ORAL | 2 refills | Status: DC | PRN

## 2020-06-27 NOTE — Progress Notes (Signed)
Patient ID: Theresa Nelson, female    DOB: December 04, 1970, 50 y.o.   MRN: [id number]   Chief Complaint  Patient presents with  . Follow-up  . Hip Pain   Subjective:    HPI   Pt here for 6 month follow up on right hip pain. Pt is having issues with right leg know. Pain is like a pulled muscle. Has burning with movement; not same pain as arthritis. Also having pain in knees. Pain is on outside of leg. No back pain. Pt job is very physical and has to use her legs often.   (My chart says left hip but it is actually right hip)  The right hip having pain still.  Doesn't have cartilage on right hip. Still diclofenac and taking it when it flares up.  Mostly hurting when sleeping.   Saw someone in [place] for the hip ortho. They recommending hip replacement, pt waiting till it's bad.  Pt feeling pain is worsening and constant.   Medical History Theresa Nelson has no past medical history on file.   Outpatient Encounter Medications as of 06/27/2020  Medication Sig  . [DISCONTINUED] diclofenac (VOLTAREN) 75 MG EC tablet TAKE 1 TABLET BY MOUTH TWICE A DAY  . [DISCONTINUED] tiZANidine (ZANAFLEX) 4 MG tablet Take 1 tablet (4 mg total) by mouth every 8 (eight) hours as needed for muscle spasms.  . diclofenac (VOLTAREN) 75 MG EC tablet Take 1 tablet (75 mg total) by mouth 2 (two) times daily.  Marland Kitchen tiZANidine (ZANAFLEX) 4 MG tablet Take 1 tablet (4 mg total) by mouth every 8 (eight) hours as needed for muscle spasms.   No facility-administered encounter medications on file as of 06/27/2020.     Review of Systems  Constitutional: Negative for chills and fever.  HENT: Negative for congestion, rhinorrhea and sore throat.   Respiratory: Negative for cough, shortness of breath and wheezing.   Cardiovascular: Negative for chest pain and leg swelling.  Gastrointestinal: Negative for abdominal pain, diarrhea, nausea and vomiting.  Genitourinary: Negative for dysuria and frequency.  Musculoskeletal: Positive for  arthralgias (rt hip pain). Negative for back pain.  Skin: Negative for rash.  Neurological: Negative for dizziness, weakness and headaches.     Vitals BP 122/68   Pulse 92   Temp (!) 97.2 F (36.2 C)   Ht 5' 5.5" (1.664 m)   Wt 128 lb 12.8 oz (58.4 kg)   SpO2 98%   BMI 21.11 kg/m   Objective:   Physical Exam Vitals and nursing note reviewed.  Constitutional:      General: She is not in acute distress.    Appearance: Normal appearance. She is not ill-appearing.  Musculoskeletal:        General: Tenderness (rt hip) present.     Comments: +anterior rt hip. Dec rom with rt hip. No swelling, erythema, or ecchymosis. normal rom of rt knee.  No pain on palpation of lumbar spinous process or paraspinal lumbar area. No rash.  Skin:    General: Skin is warm and dry.     Findings: No rash.  Neurological:     General: No focal deficit present.     Mental Status: She is alert and oriented to person, place, and time.      Assessment and Plan   1. Arthritis of right hip - Ambulatory referral to Physical Therapy - diclofenac (VOLTAREN) 75 MG EC tablet; Take 1 tablet (75 mg total) by mouth 2 (two) times daily.  Dispense: 60 tablet; Refill:  1 - tiZANidine (ZANAFLEX) 4 MG tablet; Take 1 tablet (4 mg total) by mouth every 8 (eight) hours as needed for muscle spasms.  Dispense: 30 tablet; Refill: 2    Rt hip pain- ordered PT, diclofenac and zanaflex prn. Heat/ice prn. Xray in 2017 reviewed- advanced deg joint disease of rt hip.  Return in about 6 months (around 12/27/2020) for f/u arhtirits /sciatica.

## 2020-07-04 ENCOUNTER — Ambulatory Visit (HOSPITAL_COMMUNITY): Payer: BC Managed Care – PPO | Admitting: Physical Therapy

## 2020-07-22 ENCOUNTER — Ambulatory Visit (HOSPITAL_COMMUNITY): Payer: BC Managed Care – PPO | Attending: Family Medicine | Admitting: Physical Therapy

## 2020-07-22 ENCOUNTER — Other Ambulatory Visit: Payer: Self-pay

## 2020-07-22 DIAGNOSIS — R262 Difficulty in walking, not elsewhere classified: Secondary | ICD-10-CM | POA: Insufficient documentation

## 2020-07-22 DIAGNOSIS — M5416 Radiculopathy, lumbar region: Secondary | ICD-10-CM | POA: Insufficient documentation

## 2020-07-22 NOTE — Therapy (Signed)
[facility] [address] [place], [place], [address] Phone: [phone]   Fax:  [phone]  Physical Therapy Evaluation  Patient Details  Name: Theresa Nelson MRN: [id number] Date of Birth: 1971-02-02 Referring Provider (PT): Laroy Apple   Encounter Date: 07/22/2020   PT End of Session - 07/22/20 1534    Visit Number 1    Number of Visits 8    Date for PT Re-Evaluation 08/19/20    Authorization Type BCBS Comm PPO- authorization put in    Progress Note Due on Visit 8    PT Start Time 1140    PT Stop Time 1225    PT Time Calculation (min) 45 min    Activity Tolerance Patient tolerated treatment well    Behavior During Therapy [facility] for tasks assessed/performed           No past medical history on file.  Past Surgical History:  Procedure Laterality Date  . ABDOMINAL HYSTERECTOMY    . EYE SURGERY Left 2010   left eye mass resection, benign    There were no vitals filed for this visit.    Subjective Assessment - 07/22/20 1155    Subjective Theresa Nelson states that she feels like she has a pulled mm on the ourside of her leg.  The pain feels like a burn.  The pain mainly occurs from her but to her mid calf area.   She powers through the pain.    Pertinent History OA    Limitations Sitting;Lifting;Standing;Walking;House hold activities    How long can you sit comfortably? able to sit for 10 minutes    How long can you stand comfortably? 10 minutes    How long can you walk comfortably? 10 minutes    Patient Stated Goals less pain to be able to sleep better    Currently in Pain? Yes    Pain Score 7    7/10; best 4/10   Pain Location Leg    Pain Orientation Left    Pain Descriptors / Indicators Nagging;Burning    Pain Type Chronic pain    Pain Radiating Towards calf    Pain Onset More than a month ago    Pain Frequency Intermittent    Aggravating Factors  sitting    Pain Relieving Factors not sure    Effect of Pain on Daily  Activities not sure              [facility] PT Assessment - 07/22/20 0001      Assessment   Medical Diagnosis Lt sciatica    Referring Provider (PT) Ladona Ridgel Malena    Onset Date/Surgical Date 05/03/20    Prior Therapy none      Precautions   Precautions None      Restrictions   Weight Bearing Restrictions No      Balance Screen   Has the patient fallen in the past 6 months No    Has the patient had a decrease in activity level because of a fear of falling?  No      Cognition   Overall Cognitive Status Within Functional Limits for tasks assessed      Observation/Other Assessments   Focus on Therapeutic Outcomes (FOTO)  61; 39% affected      Functional Tests   Functional tests Single leg stance;Sit to Stand      Sit to Stand   Comments 7 in 30 seconds      Posture/Postural Control   Posture Comments  Lt iliac crest high; Lt ASIS high;PSIS Low      ROM / Strength   AROM / PROM / Strength AROM;Strength      AROM   AROM Assessment Site --   functional     Strength   Strength Assessment Site Hip;Knee;Ankle    Right/Left Hip Right;Left    Right Hip Flexion 3+/5    Right Hip Extension 3+/5    Right Hip ABduction 5/5    Left Hip Flexion 5/5    Left Hip Extension 3+/5    Left Hip ABduction 5/5                      Objective measurements completed on examination: See above findings.       [facility] Adult PT Treatment/Exercise - 07/22/20 0001      Exercises   Exercises Lumbar      Lumbar Exercises: Stretches   Single Knee to Chest Stretch Right;3 reps;30 seconds      Lumbar Exercises: Supine   Bridge 10 reps      Manual Therapy   Manual Therapy Muscle Energy Technique    Manual therapy comments completed seperate from all other aspects of treatment    Muscle Energy Technique for mobilization of Lt SI jt.                  PT Education - 07/22/20 1533    Education Details HEP    Person(s) Educated Patient    Methods Explanation;Verbal  cues;Handout    Comprehension Returned demonstration            PT Short Term Goals - 07/22/20 1654      PT SHORT TERM GOAL #1   Title PT to be I in HEP to allow pain down Lt LE to be no further than mid thigh level    Time 3    Period Weeks    Status New      PT SHORT TERM GOAL #2   Title Pt pain level to be no greater than a 4/10 decrease the times the pt wakes to no greater than 3.    Time 3    Period Weeks             PT Long Term Goals - 07/22/20 1656      PT LONG TERM GOAL #1   Title PT SI to be staying in proper alignment to allow no pain in Lt LE    Time 6    Period Weeks    Status New    Target Date 09/02/20      PT LONG TERM GOAL #2   Title PT to be I in good body mechnics with lifting to decrease risk of reinjury    Time 4    Period Weeks    Status New    Target Date 08/19/20      PT LONG TERM GOAL #3   Title PT  LE strength to be at least 4+/5 to allow pt to be able to climb steps, walk up inclines without difficulty    Time 6    Period Weeks    Status New                  Plan - 07/22/20 1536    Clinical Impression Statement Theresa Nelson is a 50 yo female who states that she began having pain running from her buttock to her mid calf approximately 3 months ago which  is increasing in severity.  The pain is so bad she ks waking up5-6 x a night.  She went to her MD who referred her to skilled PT>  Evaluation demonstrates postural dysfunction, decreased activity tolerance, decreased strength and increased pain.  Ms. Cryan will benefit from skilled PT to address these issues and maximize her functioning status.    Personal Factors and Comorbidities Comorbidity 1    Comorbidities OA    Examination-Activity Limitations Locomotion Level;Stairs;Squat;Sleep    Examination-Participation Restrictions Cleaning;Occupation    Stability/Clinical Decision Making Stable/Uncomplicated    Clinical Decision Making Moderate    Rehab Potential Good    PT Frequency  2x / week    PT Duration 4 weeks    PT Treatment/Interventions Therapeutic activities;Therapeutic exercise;Patient/family education;Manual techniques    PT Next Visit Plan check SI begin stab exercises and body mechanics for lifting and pt does a lot of lifting at her work.  Concentrate on strengthening Gluteal mm    PT Home Exercise Plan bridge, Rt knee to chest           Patient will benefit from skilled therapeutic intervention in order to improve the following deficits and impairments:  Decreased activity tolerance,Decreased strength,Pain,Difficulty walking  Visit Diagnosis: Radiculopathy, lumbar region  Difficulty in walking, not elsewhere classified     Problem List Patient Active Problem List   Diagnosis Date Noted  . History of eye surgery 12/28/2019  . Arthritis of right hip 12/28/2019  . Hyperlipidemia 11/12/2012  . Generalized anxiety disorder 11/12/2012  Virgina Organ, PT CLT [phone] 07/22/2020, 5:02 PM  [facility] [address] [place], [place], [address] Phone: [phone]   Fax:  [phone]  Name: Theresa Nelson MRN: [id number] Date of Birth: [birth year]

## 2020-07-25 ENCOUNTER — Other Ambulatory Visit: Payer: Self-pay

## 2020-07-25 ENCOUNTER — Ambulatory Visit (HOSPITAL_COMMUNITY): Payer: BC Managed Care – PPO

## 2020-07-25 ENCOUNTER — Encounter (HOSPITAL_COMMUNITY): Payer: Self-pay

## 2020-07-25 DIAGNOSIS — M5416 Radiculopathy, lumbar region: Secondary | ICD-10-CM

## 2020-07-25 DIAGNOSIS — R262 Difficulty in walking, not elsewhere classified: Secondary | ICD-10-CM

## 2020-07-25 NOTE — Patient Instructions (Addendum)
Abduction: Clam (Eccentric) - Side-Lying    Lie on side with knees bent. Red theraband around knees. Lift top knee, keeping feet together. Keep trunk steady; no hip rocking. Slowly lower for 3-5 seconds. _10 reps per set, _1_ sets per day.  http://ecce.exer.us/65   Copyright  VHI. All rights reserved.   Tennis ball to trigger points in left glut.

## 2020-07-25 NOTE — Therapy (Signed)
[facility] [address] [place], [place], [address] Phone: [phone]   Fax:  [phone]  Physical Therapy Treatment  Patient Details  Name: Theresa Nelson MRN: [id number] Date of Birth: [birth year] Referring Provider (PT): Laroy Apple   Encounter Date: 07/25/2020   PT End of Session - 07/25/20 0851    Visit Number 2    Number of Visits 8    Date for PT Re-Evaluation 08/19/20    Authorization Type [employer]BS Comm PPO- authorization put in    Progress Note Due on Visit 8    PT Start Time 0853    PT Stop Time 0933    PT Time Calculation (min) 40 min    Activity Tolerance Patient tolerated treatment well    Behavior During Therapy [facility] for tasks assessed/performed           History reviewed. No pertinent past medical history.  Past Surgical History:  Procedure Laterality Date  . ABDOMINAL HYSTERECTOMY    . EYE SURGERY Left 2010   left eye mass resection, benign    There were no vitals filed for this visit.   Subjective Assessment - 07/25/20 0850    Subjective Patient reports her pain has been a little less since last session. Has been performing her HEP.    Pertinent History OA    Limitations Sitting;Lifting;Standing;Walking;House hold activities    How long can you sit comfortably? able to sit for 10 minutes    How long can you stand comfortably? 10 minutes    How long can you walk comfortably? 10 minutes    Patient Stated Goals less pain to be able to sleep better    Currently in Pain? Yes    Pain Score 5     Pain Location Hip    Pain Orientation Left;Posterior;Lateral    Pain Descriptors / Indicators Burning    Pain Type Chronic pain    Pain Radiating Towards to mid calf    Pain Onset More than a month ago            [facility] Adult PT Treatment/Exercise - 07/25/20 0001      Lumbar Exercises: Stretches   Single Knee to Chest Stretch Right;3 reps;30 seconds      Lumbar Exercises: Supine   Bridge 10 reps    Other Supine  Lumbar Exercises hook-lying hip isometric abd/add with ball and belt 10 sec in/out x3      Lumbar Exercises: Sidelying   Clam 10 reps;Both;3 seconds    Clam Limitations red theraband             PT Education - 07/25/20 0935    Education Details Reviewed initial eval, goals and initial HEP. Advanced HEP. Lifting strategies to decrease load and increase trips. Self trigger point.    Person(s) Educated Patient    Methods Explanation;Handout    Comprehension Verbalized understanding            PT Short Term Goals - 07/22/20 1654      PT SHORT TERM GOAL #1   Title PT to be I in HEP to allow pain down Lt LE to be no further than mid thigh level    Time 3    Period Weeks    Status New      PT SHORT TERM GOAL #2   Title Pt pain level to be no greater than a 4/10 decrease the times the pt wakes to no greater than 3.    Time  3    Period Weeks             PT Long Term Goals - 07/22/20 1656      PT LONG TERM GOAL #1   Title PT SI to be staying in proper alignment to allow no pain in Lt LE    Time 6    Period Weeks    Status New    Target Date 09/02/20      PT LONG TERM GOAL #2   Title PT to be I in good body mechnics with lifting to decrease risk of reinjury    Time 4    Period Weeks    Status New    Target Date 08/19/20      PT LONG TERM GOAL #3   Title PT  LE strength to be at least 4+/5 to allow pt to be able to climb steps, walk up inclines without difficulty    Time 6    Period Weeks    Status New                 Plan - 07/25/20 3382    Clinical Impression Statement Reviewed initial evaluation, goals and initial HEP. Performed manual therapy soft tissue mobilizations to left gluteals with trigger points located throughout. Patient's pain was re-created at a trigger point posterior to her left greater trochanter. Patient quite tender to palpation along her left sacral border as well. Advanced therapeutic exericses to include hoolying isometric hip  abduction/adduction for gluteal strengthening and SI joint alignment. Added this and sidelying hip clams with red theraband and self trigger point therapy to HEP as well. Discussed modifying lifting strategies to include smaller loads and more trips to decreased stresses to left hip and right arthric hip to decrease overall pain.    Personal Factors and Comorbidities Comorbidity 1    Comorbidities OA    Examination-Activity Limitations Locomotion Level;Stairs;Squat;Sleep    Examination-Participation Restrictions Cleaning;Occupation    Stability/Clinical Decision Making Stable/Uncomplicated    Rehab Potential Good    PT Frequency 2x / week    PT Duration 4 weeks    PT Treatment/Interventions Therapeutic activities;Therapeutic exercise;Patient/family education;Manual techniques    PT Next Visit Plan check SI begin stab exercises and body mechanics for lifting and pt does a lot of lifting at her work.  Concentrate on strengthening Gluteal mm; hip abd/ext    PT Home Exercise Plan bridge, Rt knee to chest; 07/25/20 - hooklying hip abd/add w/ ball/belt, sidelying clams w/ RTB           Patient will benefit from skilled therapeutic intervention in order to improve the following deficits and impairments:  Decreased activity tolerance,Decreased strength,Pain,Difficulty walking  Visit Diagnosis: Radiculopathy, lumbar region  Difficulty in walking, not elsewhere classified     Problem List Patient Active Problem List   Diagnosis Date Noted  . History of eye surgery 12/28/2019  . Arthritis of right hip 12/28/2019  . Hyperlipidemia 11/12/2012  . Generalized anxiety disorder 11/12/2012   Katina Dung. Hartnett-Rands, MS, PT [facility] [phone]  Epifanio Lesches 07/25/2020, 9:42 AM  [facility] [address] [place], [place], [address] Phone: [phone]   Fax:  [phone]  Name: Theresa Nelson MRN: [id number] Date of  Birth: 1970/04/08

## 2020-07-28 ENCOUNTER — Ambulatory Visit (HOSPITAL_COMMUNITY): Payer: BC Managed Care – PPO

## 2020-07-28 ENCOUNTER — Other Ambulatory Visit: Payer: Self-pay

## 2020-07-28 ENCOUNTER — Encounter (HOSPITAL_COMMUNITY): Payer: Self-pay

## 2020-07-28 DIAGNOSIS — M5416 Radiculopathy, lumbar region: Secondary | ICD-10-CM | POA: Diagnosis not present

## 2020-07-28 DIAGNOSIS — R262 Difficulty in walking, not elsewhere classified: Secondary | ICD-10-CM

## 2020-07-28 NOTE — Therapy (Signed)
[place], [place], [address] Phone: [phone]   Fax:  [phone]  Physical Therapy Treatment  Patient Details  Name: Theresa Nelson MRN: [id number] Date of Birth: [birth year] Referring Provider (PT): Elvia Collum   Encounter Date: 07/28/2020   PT End of Session - 07/28/20 0842    Visit Number 3    Number of Visits 8    Date for PT Re-Evaluation 08/19/20    Authorization Type BCBS Comm PPO- authorization put in    Progress Note Due on Visit 8    PT Start Time 0836    PT Stop Time 0918    PT Time Calculation (min) 42 min    Activity Tolerance Patient tolerated treatment well    Behavior During Therapy [facility] for tasks assessed/performed           History reviewed. No pertinent past medical history.  Past Surgical History:  Procedure Laterality Date  . ABDOMINAL HYSTERECTOMY    . EYE SURGERY Left 2010   left eye mass resection, benign    There were no vitals filed for this visit.   Subjective Assessment - 07/28/20 0839    Subjective Pt reports she has overall reduction in frequency and intensity in radicular symptoms ending mid lateral thigh, does occasionally go to calf when sitting or laying down. Pain scale 5/10 burning Lt gluteal and lateral thigh.    Pertinent History OA    Patient Stated Goals less pain to be able to sleep better    Currently in Pain? Yes    Pain Score 5     Pain Location Hip    Pain Orientation Left    Pain Descriptors / Indicators Burning    Pain Type Chronic pain    Pain Radiating Towards ending mid lateral thigh (was mid calf)    Pain Onset More than a month ago    Pain Frequency Intermittent    Aggravating Factors  sitting    Pain Relieving Factors not sure    Effect of Pain on Daily Activities not sure              [facility] PT Assessment - 07/28/20 0001      Assessment   Medical Diagnosis Lt sciatica    Referring Provider (PT) Lovena Le Malena    Onset Date/Surgical  Date 05/03/20    Next MD Visit 3-6 months    Prior Therapy none      Precautions   Precautions None                         [facility] Adult PT Treatment/Exercise - 07/28/20 0001      Posture/Postural Control   Posture Comments Lt iliac crest high; Lt ASIS high;PSIS Low      Exercises   Exercises Lumbar      Lumbar Exercises: Supine   Bridge 10 reps;5 seconds    Bridge Limitations 2 sets    Other Supine Lumbar Exercises hook-lying hip isometric abd/add with ball and belt 10 sec in/out x3      Lumbar Exercises: Sidelying   Clam 10 reps;Both;3 seconds    Clam Limitations red theraband    Hip Abduction 10 reps;3 seconds      Lumbar Exercises: Prone   Straight Leg Raise 10 reps;3 seconds    Straight Leg Raises Limitations pillow under hip      Manual Therapy   Manual Therapy Muscle Energy Technique  Manual therapy comments completed seperate from all other aspects of treatment    Muscle Energy Technique for mobilization of Lt SI jt.                    PT Short Term Goals - 07/22/20 1654      PT SHORT TERM GOAL #1   Title PT to be I in HEP to allow pain down Lt LE to be no further than mid thigh level    Time 3    Period Weeks    Status New      PT SHORT TERM GOAL #2   Title Pt pain level to be no greater than a 4/10 decrease the times the pt wakes to no greater than 3.    Time 3    Period Weeks             PT Long Term Goals - 07/22/20 1656      PT LONG TERM GOAL #1   Title PT SI to be staying in proper alignment to allow no pain in Lt LE    Time 6    Period Weeks    Status New    Target Date 09/02/20      PT LONG TERM GOAL #2   Title PT to be I in good body mechnics with lifting to decrease risk of reinjury    Time 4    Period Weeks    Status New    Target Date 08/19/20      PT LONG TERM GOAL #3   Title PT  LE strength to be at least 4+/5 to allow pt to be able to climb steps, walk up inclines without difficulty    Time 6     Period Weeks    Status New                 Plan - 07/28/20 0901    Clinical Impression Statement Began session with manual MET to improve SI alignment followed by core and gluteal strengthening exercises.  Reports of pain reduced following MET.  Progressed glut med strengthening wiht additinal sidelying hip abd, reports of pulling and burning Lt hip during exercise, no reports of pain with clam exercise.  Was limited by reports of arthritic pain wiht Rt hip movements.  No reports of increased pain through session.    Personal Factors and Comorbidities Comorbidity 1    Comorbidities OA    Examination-Activity Limitations Locomotion Level;Stairs;Squat;Sleep    Examination-Participation Restrictions Cleaning;Occupation    Stability/Clinical Decision Making Stable/Uncomplicated    Clinical Decision Making Moderate    Rehab Potential Good    PT Frequency 2x / week    PT Duration 4 weeks    PT Treatment/Interventions Therapeutic activities;Therapeutic exercise;Patient/family education;Manual techniques    PT Next Visit Plan check SI begin stab exercises and body mechanics for lifting and pt does a lot of lifting at her work.  Concentrate on strengthening Gluteal mm; hip abd/ext    PT Home Exercise Plan bridge, Rt knee to chest; 07/25/20 - hooklying hip abd/add w/ ball/belt, sidelying clams w/ RTB    Consulted and Agree with Plan of Care Patient           Patient will benefit from skilled therapeutic intervention in order to improve the following deficits and impairments:  Decreased activity tolerance,Decreased strength,Pain,Difficulty walking  Visit Diagnosis: Difficulty in walking, not elsewhere classified  Radiculopathy, lumbar region     Problem List Patient Active Problem  List   Diagnosis Date Noted  . History of eye surgery 12/28/2019  . Arthritis of right hip 12/28/2019  . Hyperlipidemia 11/12/2012  . Generalized anxiety disorder 11/12/2012   Ihor Austin, LPTA/CLT;  CBIS [phone]  Aldona Lento 07/28/2020, 9:30 AM  [place], [place], [address] Phone: [phone]   Fax:  [phone]  Name: Theresa Nelson MRN: [id number] Date of Birth: 1970/04/26

## 2020-08-10 ENCOUNTER — Encounter (HOSPITAL_COMMUNITY): Payer: BC Managed Care – PPO | Admitting: Physical Therapy

## 2021-03-08 ENCOUNTER — Encounter (HOSPITAL_COMMUNITY): Payer: Self-pay | Admitting: Physical Therapy

## 2021-03-08 NOTE — Therapy (Signed)
[place], [place], [address] Phone: [phone]   Fax:  [phone]  Patient Details  Name: Theresa Nelson MRN: [id number] Date of Birth: 08-11-1970 Referring Provider:  No ref. provider found  Encounter Date: 03/08/2021  PHYSICAL THERAPY DISCHARGE SUMMARY  Visits from Start of Care: 3  Current functional level related to goals / functional outcomes: PT last subjective comment states that she was feeling better.    Remaining deficits: LBP with occasional radicular sx   Education / Equipment: HEP   Patient agrees to discharge. Patient goals were partially met. Patient is being discharged due to not returning since the last visit.  Rayetta Humphrey, PT CLT [phone]  03/08/2021, 9:39 AM  [place] [address] [place], [place], [address] Phone: [phone]   Fax:  [phone]

## 2022-01-19 ENCOUNTER — Encounter: Payer: Self-pay | Admitting: Obstetrics & Gynecology

## 2022-01-19 ENCOUNTER — Ambulatory Visit (INDEPENDENT_AMBULATORY_CARE_PROVIDER_SITE_OTHER): Payer: BC Managed Care – PPO | Admitting: Obstetrics & Gynecology

## 2022-01-19 VITALS — BP 109/67 | HR 64 | Ht 65.0 in | Wt 126.0 lb

## 2022-01-19 DIAGNOSIS — Z13220 Encounter for screening for lipoid disorders: Secondary | ICD-10-CM | POA: Diagnosis not present

## 2022-01-19 DIAGNOSIS — Z131 Encounter for screening for diabetes mellitus: Secondary | ICD-10-CM

## 2022-01-19 DIAGNOSIS — Z1231 Encounter for screening mammogram for malignant neoplasm of breast: Secondary | ICD-10-CM | POA: Diagnosis not present

## 2022-01-19 DIAGNOSIS — Z1329 Encounter for screening for other suspected endocrine disorder: Secondary | ICD-10-CM | POA: Diagnosis not present

## 2022-01-19 DIAGNOSIS — Z1211 Encounter for screening for malignant neoplasm of colon: Secondary | ICD-10-CM

## 2022-01-19 NOTE — Patient Instructions (Signed)
Please schedule a mammogram at one of the following locations:  [place]: [phone]  [facility] [place]:[phone] [address]  

## 2022-01-19 NOTE — Progress Notes (Signed)
   GYN VISIT Patient name: Theresa Nelson MRN [id number]  Date of birth: August 29, 1970 Chief Complaint:   Menopause (Hot flashes)  History of Present Illness:   Theresa Nelson is a 51 y.o. G85P1001 PH female being seen today for the following concerns:  Vasomotor symptoms: Notes hot flashes and increased emotional lability.  States that work is very stressful and she gets emotional very easily.  Pt became teary-eyed in discussing.  This has been going on for the past 1-2 yrs.  Rates her symptoms a 6/10.  She reports that she just wants to make sure everything is "ok".  She typically doesn't come to the doctor unless she is sick and since she had a hyst she was under the impression that she didn't really need to come.  She has also not seen her PCP in quite some time.  No LMP recorded. Patient has had a hysterectomy.     01/19/2022    9:02 AM 06/27/2020   10:00 AM  Depression screen PHQ 2/9  Decreased Interest 1 0  Down, Depressed, Hopeless 1 0  PHQ - 2 Score 2 0  Altered sleeping 2   Tired, decreased energy 1   Change in appetite 0   Feeling bad or failure about yourself  1   Trouble concentrating 0   Moving slowly or fidgety/restless 0   Suicidal thoughts 0   PHQ-9 Score 6      Review of Systems:   Pertinent items are noted in HPI Denies fever/chills, dizziness, headaches, visual disturbances, fatigue, shortness of breath, chest pain, abdominal pain, vomiting/ Pertinent History Reviewed:  Reviewed past medical,surgical, social, obstetrical and family history.  Reviewed problem list, medications and allergies. Physical Assessment:   Vitals:   01/19/22 0903  BP: 109/67  Pulse: 64  Weight: 126 lb (57.2 kg)  Height: 5\' 5"  (1.651 m)  Body mass index is 20.97 kg/m.       Physical Examination:   General appearance: alert, well appearing, and in no distress  Psych: mood appropriate, normal affect  Skin: warm & dry   Cardiovascular: normal heart rate noted  Respiratory: normal  respiratory effort, no distress  Abdomen: soft, non-tender, no rebound, no guarding  Pelvic: VULVA: normal appearing vulva with no masses, tenderness or lesions, VAGINA: normal appearing vagina with normal color and discharge, no lesions, ADNEXA: normal adnexa in size, nontender and no masses.  Uterus and cervix surgically absent  Extremities: no edema   Chaperone:  pt declined     Assessment & Plan:  1) Vasomotor symptoms -reviewed menopausal symptoms -discussed conservative vs medical management  2) Preventive screening -encouraged pt to complete "annual" -lab work today and mammogram ordered -reviewed colon cancer screening- cologuard ordered  Orders Placed This Encounter  Procedures   MM 3D SCREEN BREAST BILATERAL   Lipid panel   TSH   Comprehensive metabolic panel   Cologuard    Return in about 1 year (around 01/20/2023) for Annual.   Theresa Pupa, DO Attending [place], [place] for [place], [place]

## 2022-01-20 LAB — LIPID PANEL
Chol/HDL Ratio: 3.5 ratio (ref 0.0–4.4)
Cholesterol, Total: 226 mg/dL — ABNORMAL HIGH (ref 100–199)
HDL: 64 mg/dL (ref >39)
LDL Chol Calc (NIH): 145 mg/dL — ABNORMAL HIGH (ref 0–99)
Triglycerides: 94 mg/dL (ref 0–149)
VLDL Cholesterol Cal: 17 mg/dL (ref 5–40)

## 2022-01-20 LAB — COMPREHENSIVE METABOLIC PANEL
ALT: 15 IU/L (ref 0–32)
AST: 18 IU/L (ref 0–40)
Albumin/Globulin Ratio: 1.8 (ref 1.2–2.2)
Albumin: 4.5 g/dL (ref 3.8–4.9)
Alkaline Phosphatase: 99 IU/L (ref 44–121)
BUN/Creatinine Ratio: 19 (ref 9–23)
BUN: 14 mg/dL (ref 6–24)
Bilirubin Total: 0.6 mg/dL (ref 0.0–1.2)
CO2: 23 mmol/L (ref 20–29)
Calcium: 9.4 mg/dL (ref 8.7–10.2)
Chloride: 102 mmol/L (ref 96–106)
Creatinine, Ser: 0.74 mg/dL (ref 0.57–1.00)
Globulin, Total: 2.5 g/dL (ref 1.5–4.5)
Glucose: 91 mg/dL (ref 70–99)
Potassium: 4.5 mmol/L (ref 3.5–5.2)
Sodium: 139 mmol/L (ref 134–144)
Total Protein: 7 g/dL (ref 6.0–8.5)
eGFR: 98 mL/min/{1.73_m2} (ref >59)

## 2022-02-14 ENCOUNTER — Ambulatory Visit (INDEPENDENT_AMBULATORY_CARE_PROVIDER_SITE_OTHER): Payer: BC Managed Care – PPO

## 2022-02-14 ENCOUNTER — Encounter: Payer: Self-pay | Admitting: Orthopedic Surgery

## 2022-02-14 ENCOUNTER — Ambulatory Visit (INDEPENDENT_AMBULATORY_CARE_PROVIDER_SITE_OTHER): Payer: BC Managed Care – PPO | Admitting: Orthopedic Surgery

## 2022-02-14 VITALS — Ht 66.0 in | Wt 125.0 lb

## 2022-02-14 DIAGNOSIS — M25551 Pain in right hip: Secondary | ICD-10-CM

## 2022-02-14 DIAGNOSIS — M1611 Unilateral primary osteoarthritis, right hip: Secondary | ICD-10-CM

## 2022-02-14 DIAGNOSIS — M5431 Sciatica, right side: Secondary | ICD-10-CM

## 2022-02-14 MED ORDER — TIZANIDINE HCL 4 MG PO TABS: 4.0000 mg | ORAL_TABLET | Freq: Three times a day (TID) | ORAL | 0 refills | Status: DC | PRN

## 2022-02-14 MED ORDER — PREDNISONE 10 MG (21) PO TBPK: ORAL_TABLET | ORAL | 0 refills | Status: DC

## 2022-02-14 NOTE — Progress Notes (Unsigned)
New Patient Visit  Assessment: Theresa Nelson is a 51 y.o. female with the following: 1. Arthritis of right hip 2. Sciatica, right side   Plan: Theresa Nelson has severe pain and disability due to severe right hip arthritis.  This has been progressively worsening.  She has had injections in the past.  Physical therapy has not been effective.  Based on her presentation, as well as the radiographs, which were reviewed with the patient in clinic today, I think she is a good candidate for total hip arthroplasty, even though she is young for this type of procedure.  Nonetheless, I would like her to be evaluated by Dr. Magnus Ivan for further discussion regarding the procedure.  We will place a referral in clinic today.  She she is also complaining of burning pain in the right buttock, with occasional radiating pain distally.  As a result, I think she has an acute case of sciatica.  I provided her with a prednisone Dosepak.  If she has any further issues, she will contact the clinic.  Follow-up: Return for Referral to Dr. Magnus Ivan.  Subjective:  Chief Complaint  Patient presents with   Hip Pain    Rt side hip and burning pain in buttocks for years    History of Present Illness: Theresa Nelson is a 51 y.o. female who has been referred by Laroy Apple, DO For evaluation of right hip pain.  She states she has had pain in the right hip area for several years.  This has been progressively worsening.  She was told several years ago, that she may require a hip replacement due to severe arthritis.  She continues to have a lot of pain discomfort in the right groin.  More recently, she started develop some pain in the lower back, which radiates into the right buttock, occasionally it will radiate distally.  Medications has not been helpful.  She has tried physical therapy for her right hip.  She has had injections, without sustained relief.  No history of an injury to her right hip.  She is not treated as a child for hip  dysplasia.   Review of Systems: No fevers or chills No numbness or tingling No chest pain No shortness of breath No bowel or bladder dysfunction No GI distress No headaches   Medical History:  No past medical history on file.  Past Surgical History:  Procedure Laterality Date   ABDOMINAL HYSTERECTOMY     EYE SURGERY Left 2010   left eye mass resection, benign    Family History  Problem Relation Age of Onset   COPD Mother    Cancer Father    Alcohol abuse Father    Social History   Tobacco Use   Smoking status: Never   Smokeless tobacco: Never  Vaping Use   Vaping Use: Never used  Substance Use Topics   Alcohol use: Not Currently   Drug use: Never    No Known Allergies  Current Meds  Medication Sig   predniSONE (STERAPRED UNI-PAK 21 TAB) 10 MG (21) TBPK tablet 10 mg DS 12 as directed   tiZANidine (ZANAFLEX) 4 MG tablet Take 1 tablet (4 mg total) by mouth every 8 (eight) hours as needed for muscle spasms.    Objective: Ht 5\' 6"  (1.676 m)   Wt 125 lb (56.7 kg)   BMI 20.18 kg/m   Physical Exam:  General: Alert and oriented. and No acute distress. Gait: Right sided antalgic gait.  Evaluation of the right hip demonstrates  a flexion contracture.  She is unable to fully extend her hip.  She is locked in approximately 15 degrees of external rotation.  No internal rotation.  She does tolerate external rotation from 15-40 degrees.  She is able to maintain a straight leg raise.  Mild tenderness to palpation within the right buttock.  Toes are warm and well-perfused.  Sensation is intact distally.  2+ DP pulse.  IMAGING: I personally ordered and reviewed the following images  X-rays of the right hip were obtained in clinic today.  No acute injuries are noted.  Well-maintained joint space within the left hip.  Complete loss of joint space within the right hip.  There is bone-on-bone articulation, with deformity of the femoral head neck junction.  Impression:  Severe right hip arthritis   Lumbar spine x-rays were obtained in clinic today.  No acute injuries noted.  Well-maintained disc heights.  No evidence of anterolisthesis.  Minimal degenerative changes.  Impression:  negative lumbar spine x-rays   New Medications:  Meds ordered this encounter  Medications   predniSONE (STERAPRED UNI-PAK 21 TAB) 10 MG (21) TBPK tablet    Sig: 10 mg DS 12 as directed    Dispense:  48 tablet    Refill:  0   tiZANidine (ZANAFLEX) 4 MG tablet    Sig: Take 1 tablet (4 mg total) by mouth every 8 (eight) hours as needed for muscle spasms.    Dispense:  20 tablet    Refill:  0      Oliver Barre, MD  02/15/2022 9:58 AM

## 2022-02-15 ENCOUNTER — Encounter: Payer: Self-pay | Admitting: Orthopedic Surgery

## 2022-02-25 LAB — COLOGUARD: COLOGUARD: NEGATIVE

## 2022-02-26 ENCOUNTER — Ambulatory Visit (INDEPENDENT_AMBULATORY_CARE_PROVIDER_SITE_OTHER): Payer: BC Managed Care – PPO | Admitting: Physician Assistant

## 2022-02-26 DIAGNOSIS — M1611 Unilateral primary osteoarthritis, right hip: Secondary | ICD-10-CM

## 2022-02-26 MED ORDER — DICLOFENAC SODIUM 75 MG PO TBEC: 75.0000 mg | DELAYED_RELEASE_TABLET | Freq: Two times a day (BID) | ORAL | 1 refills | Status: DC

## 2022-02-26 NOTE — Progress Notes (Signed)
Office Visit Note   Patient: Theresa Nelson           Date of Birth: 07-May-1970           MRN: [id number] Visit Date: 02/26/2022              Requested by: Oliver Barre, MD [phone] S. [address] [place],  [place] [address] PCP: Annalee Genta, DO   Assessment & Plan: Visit Diagnoses:  1. Arthritis of right hip     Plan: Given the patient's worsening hip pain and  findings on radiographs, recommend right total hip arthroplasty.  Patient's failed conservative treatment which is included time, modification of activities, stretching, and oral NSAIDs.  Risk benefits surgery along with postoperative protocol discussed with patient.  Handout on total hip arthroplasty given.  Risk include but are not limited to nerve/vessel injury, leg length discrepancy, DVT/PE and blood loss.  Questions were encouraged and answered by Dr. Magnus Ivan self.  Will see her back 2 weeks post follow-up.  Follow-Up Instructions: No follow-ups on file.   Orders:  No orders of the defined types were placed in this encounter.  Meds ordered this encounter  Medications   diclofenac (VOLTAREN) 75 MG EC tablet    Sig: Take 1 tablet (75 mg total) by mouth 2 (two) times daily.    Dispense:  60 tablet    Refill:  1      Procedures: No procedures performed   Clinical Data: No additional findings.   Subjective: Chief Complaint  Patient presents with   Right Hip - Pain    HPI Theresa Nelson is 51 year old female comes in today with right hip pain.  She was referred by Dr. Dallas Schimke.  She states she is having worsening right hip pain since 2017.  Pain is became such that it is 10 out of 10 by the end of the day.  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  She states the only thing that helps with her pain is lying down.  She has had no injury.  However she used to speeds gait and play volleyball.  She has tried stretching and diclofenac.  Diclofenac helps some.  States she has difficulty donning shoes and socks on the right  side. Radiographs of her right hip in 2017 showed near bone on bone arthritis , Most recent films read  by Dr. Dallas Schimke  showed right hip to be bone on bone with deformity of the femoral head neck junction.   Review of Systems  Constitutional:  Negative for chills and fever.     Objective: Vital Signs: There were no vitals taken for this visit.  Physical Exam Constitutional:      Appearance: She is normal weight. She is not ill-appearing or diaphoretic.  Pulmonary:     Effort: Pulmonary effort is normal.  Neurological:     Mental Status: She is alert and oriented to person, place, and time.  Psychiatric:        Mood and Affect: Mood normal.     Ortho Exam Bilateral hips: Left hip excellent range of motion without pain.  Right hip limited external rotation and virtually no internal rotation.  Calf supple nontender.  Slight leg length discrepancy with the right leg shorter.  Ambulates without any assistive device with an antalgic gait. Specialty Comments:  No specialty comments available.  Imaging: No results found.   PMFS History: Patient Active Problem List   Diagnosis Date Noted   History of eye  surgery 12/28/2019   Arthritis of right hip 12/28/2019   Hyperlipidemia 11/12/2012   Generalized anxiety disorder 11/12/2012   No past medical history on file.  Family History  Problem Relation Age of Onset   COPD Mother    Cancer Father    Alcohol abuse Father     Past Surgical History:  Procedure Laterality Date   ABDOMINAL HYSTERECTOMY     EYE SURGERY Left 2010   left eye mass resection, benign   Social History   Occupational History   Not on file  Tobacco Use   Smoking status: Never   Smokeless tobacco: Never  Vaping Use   Vaping Use: Never used  Substance and Sexual Activity   Alcohol use: Not Currently   Drug use: Never   Sexual activity: Yes    Birth control/protection: Surgical

## 2022-02-27 ENCOUNTER — Telehealth (INDEPENDENT_AMBULATORY_CARE_PROVIDER_SITE_OTHER): Payer: BC Managed Care – PPO | Admitting: Physician Assistant

## 2022-02-27 NOTE — Telephone Encounter (Signed)
Thank you :)

## 2022-02-27 NOTE — Telephone Encounter (Signed)
Please get images on pelvis pushed so we can review them .

## 2022-04-09 ENCOUNTER — Ambulatory Visit (HOSPITAL_COMMUNITY)
Admission: RE | Admit: 2022-04-09 | Discharge: 2022-04-09 | Disposition: A | Discharge status: Home / Self Care | Payer: BC Managed Care – PPO | Source: Ambulatory Visit | Attending: Obstetrics & Gynecology | Admitting: Obstetrics & Gynecology

## 2022-04-09 DIAGNOSIS — Z1231 Encounter for screening mammogram for malignant neoplasm of breast: Secondary | ICD-10-CM | POA: Diagnosis not present

## 2022-04-12 NOTE — Progress Notes (Signed)
I have seen and examined the patient and agree with Benita Stabile, PA-C's assessment and plan.  The patient has severe end-stage arthritis of her right hip with bone-on-bone wear that was evident on x-rays in 2017 and even recent x-rays this year.  Her pain is daily with the right hip and is detrimentally affecting her mobility, her quality of life and her actives of daily living.  We spoke in length in detail about the recommendation for total hip arthroplasty for the right hip.  She was given a handout about anterior hip surgery and we described in detail what the surgery involves.  Her x-rays were reviewed with her as well as a hip replacement model.  The risks and benefits of surgery as well as what to expect from an intraoperative and postoperative course were described in detail.

## 2022-04-13 ENCOUNTER — Other Ambulatory Visit: Payer: Self-pay

## 2022-04-25 ENCOUNTER — Other Ambulatory Visit: Payer: Self-pay | Admitting: Physician Assistant

## 2022-04-25 DIAGNOSIS — Z01818 Encounter for other preprocedural examination: Secondary | ICD-10-CM

## 2022-04-29 ENCOUNTER — Other Ambulatory Visit: Payer: Self-pay | Admitting: Physician Assistant

## 2022-04-29 DIAGNOSIS — M1611 Unilateral primary osteoarthritis, right hip: Secondary | ICD-10-CM

## 2022-05-01 NOTE — Pre-Procedure Instructions (Signed)
Surgical Instructions    Your procedure is scheduled on Tuesday 05/08/22.   Report to [facility] Main Entrance "A" at 07:40 A.M., then check in with the Admitting office.  Call this number if you have problems the morning of surgery:  [phone]   If you have any questions prior to your surgery date call [phone]: Open Monday-Friday 8am-4pm If you experience any cold or flu symptoms such as cough, fever, chills, shortness of breath, etc. between now and your scheduled surgery, please notify us at the above number     Remember:  Do not eat after midnight the night before your surgery  You may drink clear liquids until 06:40 A.M. the morning of your surgery.   Clear liquids allowed are: Water, Non-Citrus Juices (without pulp), Carbonated Beverages, Clear Tea, Black Coffee ONLY (NO MILK, CREAM OR POWDERED CREAMER of any kind), and Gatorade  Patient Instructions  The night before surgery:  No food after midnight. ONLY clear liquids after midnight  The day of surgery (if you do NOT have diabetes):  Drink ONE (1) Pre-Surgery Clear Ensure by 06:40 A.M. the morning of surgery. Drink in one sitting. Do not sip.  This drink was given to you during your hospital  pre-op appointment visit.  Nothing else to drink after completing the  Pre-Surgery Clear Ensure.         If you have questions, please contact your surgeon's office.     Take these medicines the morning of surgery with A SIP OF WATER:   tiZANidine (ZANAFLEX)- If needed   As of today, STOP taking any Aspirin (unless otherwise instructed by your surgeon) Aleve, Naproxen, Ibuprofen, Motrin, Advil, Goody's, BC's, all herbal medications, fish oil, diclofenac (VOLTAREN), and all vitamins.           Do not wear jewelry or makeup. Do not wear lotions, powders, perfumes/cologne or deodorant. Do not shave 48 hours prior to surgery.  Men may shave face and neck. Do not bring valuables to the hospital. Do not wear nail polish,  gel polish, artificial nails, or any other type of covering on natural nails (fingers and toes) If you have artificial nails or gel coating that need to be removed by a nail salon, please have this removed prior to surgery. Artificial nails or gel coating may interfere with anesthesia's ability to adequately monitor your vital signs.  [facility] is not responsible for any belongings or valuables.    Do NOT Smoke (Tobacco/Vaping)  24 hours prior to your procedure  If you use a CPAP at night, you may bring your mask for your overnight stay.   Contacts, glasses, hearing aids, dentures or partials may not be worn into surgery, please bring cases for these belongings   For patients admitted to the hospital, discharge time will be determined by your treatment team.   Patients discharged the day of surgery will not be allowed to drive home, and someone needs to stay with them for 24 hours.   SURGICAL WAITING ROOM VISITATION Patients having surgery or a procedure may have no more than 2 support people in the waiting area - these visitors may rotate.   Children under the age of 44 must have an adult with them who is not the patient. If the patient needs to stay at the hospital during part of their recovery, the visitor guidelines for inpatient rooms apply. Pre-op nurse will coordinate an appropriate time for 1 support person to accompany patient in pre-op.  This support person  may not rotate.   Please refer to [email or web address] for the visitor guidelines for Inpatients (after your surgery is over and you are in a regular room).    Special instructions:    Oral Hygiene is also important to reduce your risk of infection.  Remember - BRUSH YOUR TEETH THE MORNING OF SURGERY WITH YOUR REGULAR TOOTHPASTE   [facility]- Preparing For Surgery  Before surgery, you can play an important role. Because skin is not sterile, your skin needs to be as free  of germs as possible. You can reduce the number of germs on your skin by washing with CHG (chlorahexidine gluconate) Soap before surgery.  CHG is an antiseptic cleaner which kills germs and bonds with the skin to continue killing germs even after washing.     Please do not use if you have an allergy to CHG or antibacterial soaps. If your skin becomes reddened/irritated stop using the CHG.  Do not shave (including legs and underarms) for at least 48 hours prior to first CHG shower. It is OK to shave your face.  Please follow these instructions carefully.     Shower the NIGHT BEFORE SURGERY and the MORNING OF SURGERY with CHG Soap.   If you chose to wash your hair, wash your hair first as usual with your normal shampoo. After you shampoo, rinse your hair and body thoroughly to remove the shampoo.  Then [employer] and genitals (private parts) with your normal soap and rinse thoroughly to remove soap.  After that Use CHG Soap as you would any other liquid soap. You can apply CHG directly to the skin and wash gently with a scrungie or a clean washcloth.   Apply the CHG Soap to your body ONLY FROM THE NECK DOWN.  Do not use on open wounds or open sores. Avoid contact with your eyes, ears, mouth and genitals (private parts). Wash Face and genitals (private parts)  with your normal soap.   Wash thoroughly, paying special attention to the area where your surgery will be performed.  Thoroughly rinse your body with warm water from the neck down.  DO NOT shower/wash with your normal soap after using and rinsing off the CHG Soap.  Pat yourself dry with a CLEAN TOWEL.  Wear CLEAN PAJAMAS to bed the night before surgery  Place CLEAN SHEETS on your bed the night before your surgery  DO NOT SLEEP WITH PETS.   Day of Surgery:  Take a shower with CHG soap. Wear Clean/Comfortable clothing the morning of surgery Do not apply any deodorants/lotions.   Remember to brush your teeth WITH YOUR REGULAR  TOOTHPASTE.    If you received a COVID test during your pre-op visit, it is requested that you wear a mask when out in public, stay away from anyone that may not be feeling well, and notify your surgeon if you develop symptoms. If you have been in contact with anyone that has tested positive in the last 10 days, please notify your surgeon.    Please read over the following fact sheets that you were given.

## 2022-05-02 ENCOUNTER — Encounter (HOSPITAL_COMMUNITY): Payer: Self-pay

## 2022-05-02 ENCOUNTER — Encounter (HOSPITAL_COMMUNITY)
Admission: RE | Admit: 2022-05-02 | Discharge: 2022-05-02 | Disposition: A | Discharge status: Home / Self Care | Payer: BC Managed Care – PPO | Source: Ambulatory Visit | Attending: Orthopaedic Surgery | Admitting: Orthopaedic Surgery

## 2022-05-02 ENCOUNTER — Other Ambulatory Visit: Payer: Self-pay

## 2022-05-02 DIAGNOSIS — Z01818 Encounter for other preprocedural examination: Secondary | ICD-10-CM

## 2022-05-02 DIAGNOSIS — Z01812 Encounter for preprocedural laboratory examination: Secondary | ICD-10-CM | POA: Insufficient documentation

## 2022-05-02 HISTORY — DX: Other specified postprocedural states: R11.2

## 2022-05-02 HISTORY — DX: Other specified postprocedural states: Z98.890

## 2022-05-02 HISTORY — DX: Unspecified osteoarthritis, unspecified site: M19.90

## 2022-05-02 LAB — CBC
HCT: 40.6 % (ref 36.0–46.0)
Hemoglobin: 13.8 g/dL (ref 12.0–15.0)
MCH: 29.9 pg (ref 26.0–34.0)
MCHC: 34 g/dL (ref 30.0–36.0)
MCV: 88.1 fL (ref 80.0–100.0)
Platelets: 306 10*3/uL (ref 150–400)
RBC: 4.61 MIL/uL (ref 3.87–5.11)
RDW: 12.1 % (ref 11.5–15.5)
WBC: 8.5 10*3/uL (ref 4.0–10.5)
nRBC: 0 % (ref 0.0–0.2)

## 2022-05-02 LAB — TYPE AND SCREEN
ABO/RH(D): A POS
Antibody Screen: NEGATIVE

## 2022-05-02 LAB — COMPREHENSIVE METABOLIC PANEL
ALT: 19 U/L (ref 0–44)
AST: 20 U/L (ref 15–41)
Albumin: 3.8 g/dL (ref 3.5–5.0)
Alkaline Phosphatase: 81 U/L (ref 38–126)
Anion gap: 6 (ref 5–15)
BUN: 14 mg/dL (ref 6–20)
CO2: 26 mmol/L (ref 22–32)
Calcium: 8.9 mg/dL (ref 8.9–10.3)
Chloride: 105 mmol/L (ref 98–111)
Creatinine, Ser: 0.63 mg/dL (ref 0.44–1.00)
GFR, Estimated: >60 mL/min (ref >60)
Glucose, Bld: 101 mg/dL — ABNORMAL HIGH (ref 70–99)
Potassium: 4.4 mmol/L (ref 3.5–5.1)
Sodium: 137 mmol/L (ref 135–145)
Total Bilirubin: 0.5 mg/dL (ref 0.3–1.2)
Total Protein: 6.5 g/dL (ref 6.5–8.1)

## 2022-05-02 LAB — SURGICAL PCR SCREEN
MRSA, PCR: NEGATIVE
Staphylococcus aureus: NEGATIVE

## 2022-05-02 NOTE — Progress Notes (Signed)
PCP -Lanelle Bal PA-C Cardiologist - denies  PPM/ICD - denies Device Orders -  Rep Notified -   Chest x-ray - na EKG - na Stress Test - denies ECHO -denies  Cardiac Cath - denies  Sleep Study - denies CPAP -   Fasting Blood Sugar - na Checks Blood Sugar _____ times a day  Last dose of GLP1 agonist-  na GLP1 instructions:   Blood Thinner Instructions:na Aspirin Instructions:na  ERAS Protcol - clear liquids until 0640 PRE-SURGERY Ensure or G2- Ensure  COVID TEST- na   Anesthesia review: no  Patient denies shortness of breath, fever, cough and chest pain at PAT appointment   All instructions explained to the patient, with a verbal understanding of the material. Patient agrees to go over the instructions while at home for a better understanding. Patient also instructed to wear a mask when out in public prior to surgery. . The opportunity to ask questions was provided.

## 2022-05-07 NOTE — H&P (Signed)
TOTAL HIP ADMISSION H&P  Patient is admitted for right total hip arthroplasty.  Subjective:  Chief Complaint: right hip pain  HPI: Theresa Nelson, 52 y.o. female, has a history of pain and functional disability in the right hip(s) due to arthritis and patient has failed non-surgical conservative treatments for greater than 12 weeks to include NSAID's and/or analgesics and activity modification.  Onset of symptoms was gradual starting 7 years ago with gradually worsening course since that time.The patient noted no past surgery on the right hip(s).  Patient currently rates pain in the right hip at 10 out of 10 with activity. Patient has night pain, worsening of pain with activity and weight bearing, trendelenberg gait, pain that interfers with activities of daily living, and pain with passive range of motion. Patient has evidence of subchondral cysts, subchondral sclerosis, periarticular osteophytes, and joint space narrowing by imaging studies. This condition presents safety issues increasing the risk of falls.  There is no current active infection.  Patient Active Problem List   Diagnosis Date Noted   History of eye surgery 12/28/2019   Unilateral primary osteoarthritis, right hip 12/28/2019   Hyperlipidemia 11/12/2012   Generalized anxiety disorder 11/12/2012   Past Medical History:  Diagnosis Date   Arthritis    PONV (postoperative nausea and vomiting)     Past Surgical History:  Procedure Laterality Date   ABDOMINAL HYSTERECTOMY     EYE SURGERY Left 2010   left eye mass resection, benign    No current facility-administered medications for this encounter.   Current Outpatient Medications  Medication Sig Dispense Refill Last Dose   diclofenac (VOLTAREN) 75 MG EC tablet TAKE 1 TABLET BY MOUTH TWICE A DAY 60 tablet 1    tiZANidine (ZANAFLEX) 4 MG tablet Take 1 tablet (4 mg total) by mouth every 8 (eight) hours as needed for muscle spasms. 20 tablet 0    predniSONE (STERAPRED UNI-PAK 21  TAB) 10 MG (21) TBPK tablet 10 mg DS 12 as directed (Patient not taking: Reported on 04/30/2022) 48 tablet 0 Not Taking   No Known Allergies  Social History   Tobacco Use   Smoking status: Never   Smokeless tobacco: Never  Substance Use Topics   Alcohol use: Not Currently    Family History  Problem Relation Age of Onset   COPD Mother    Cancer Father    Alcohol abuse Father      Review of Systems  All other systems reviewed and are negative.   Objective:  Physical Exam Vitals reviewed.  Constitutional:      Appearance: She is normal weight.  HENT:     Head: Normocephalic and atraumatic.  Eyes:     Extraocular Movements: Extraocular movements intact.     Pupils: Pupils are equal, round, and reactive to light.  Cardiovascular:     Rate and Rhythm: Normal rate.     Pulses: Normal pulses.  Pulmonary:     Effort: Pulmonary effort is normal.     Breath sounds: Normal breath sounds.  Abdominal:     Palpations: Abdomen is soft.  Musculoskeletal:     Cervical back: Normal range of motion and neck supple.     Right hip: Tenderness and bony tenderness present. Decreased range of motion. Decreased strength.  Neurological:     Mental Status: She is alert and oriented to person, place, and time.  Psychiatric:        Behavior: Behavior normal.     Vital signs in last 24 hours:  Labs:   Estimated body mass index is 21.83 kg/m as calculated from the following:   Height as of 05/02/22: 5' 5"$  (1.651 m).   Weight as of 05/02/22: 59.5 kg.   Imaging Review Plain radiographs demonstrate severe degenerative joint disease of the right hip(s). The bone quality appears to be good for age and reported activity level.      Assessment/Plan:  End stage arthritis, right hip(s)  The patient history, physical examination, clinical judgement of the provider and imaging studies are consistent with end stage degenerative joint disease of the right hip(s) and total hip arthroplasty  is deemed medically necessary. The treatment options including medical management, injection therapy, arthroscopy and arthroplasty were discussed at length. The risks and benefits of total hip arthroplasty were presented and reviewed. The risks due to aseptic loosening, infection, stiffness, dislocation/subluxation,  thromboembolic complications and other imponderables were discussed.  The patient acknowledged the explanation, agreed to proceed with the plan and consent was signed. Patient is being admitted for inpatient treatment for surgery, pain control, PT, OT, prophylactic antibiotics, VTE prophylaxis, progressive ambulation and ADL's and discharge planning.The patient is planning to be discharged home with home health services

## 2022-05-08 ENCOUNTER — Ambulatory Visit (HOSPITAL_COMMUNITY): Payer: BC Managed Care – PPO | Admitting: Anesthesiology

## 2022-05-08 ENCOUNTER — Encounter (HOSPITAL_COMMUNITY): Payer: Self-pay | Admitting: Orthopaedic Surgery

## 2022-05-08 ENCOUNTER — Encounter (HOSPITAL_COMMUNITY)
Admission: RE | Disposition: A | Discharge status: Home Health | Payer: Self-pay | Source: Home / Self Care | Attending: Orthopaedic Surgery

## 2022-05-08 ENCOUNTER — Observation Stay (HOSPITAL_COMMUNITY): Payer: BC Managed Care – PPO

## 2022-05-08 ENCOUNTER — Observation Stay (HOSPITAL_COMMUNITY)
Admission: RE | Admit: 2022-05-08 | Discharge: 2022-05-09 | Disposition: A | Discharge status: Home Health | Payer: BC Managed Care – PPO | Attending: Orthopaedic Surgery | Admitting: Orthopaedic Surgery

## 2022-05-08 ENCOUNTER — Other Ambulatory Visit: Payer: Self-pay

## 2022-05-08 ENCOUNTER — Ambulatory Visit (HOSPITAL_COMMUNITY): Payer: BC Managed Care – PPO

## 2022-05-08 DIAGNOSIS — Z96641 Presence of right artificial hip joint: Secondary | ICD-10-CM

## 2022-05-08 DIAGNOSIS — M1611 Unilateral primary osteoarthritis, right hip: Principal | ICD-10-CM | POA: Diagnosis present

## 2022-05-08 HISTORY — PX: TOTAL HIP ARTHROPLASTY: SHX124

## 2022-05-08 LAB — ABO/RH: ABO/RH(D): A POS

## 2022-05-08 SURGERY — ARTHROPLASTY, HIP, TOTAL, ANTERIOR APPROACH
Anesthesia: Spinal | Site: Hip | Laterality: Right

## 2022-05-08 MED ORDER — ACETAMINOPHEN 500 MG PO TABS
1000.0000 mg | ORAL_TABLET | Freq: Once | ORAL | Status: AC
  Administered 2022-05-08: 1000 mg via ORAL
  Filled 2022-05-08: qty 2

## 2022-05-08 MED ORDER — CEFAZOLIN SODIUM-DEXTROSE 1-4 GM/50ML-% IV SOLN
1.0000 g | Freq: Four times a day (QID) | INTRAVENOUS | Status: AC
  Administered 2022-05-08 (×2): 1 g via INTRAVENOUS
  Filled 2022-05-08 (×2): qty 50

## 2022-05-08 MED ORDER — TRANEXAMIC ACID-NACL 1000-0.7 MG/100ML-% IV SOLN
1000.0000 mg | INTRAVENOUS | Status: AC
  Administered 2022-05-08: 1000 mg via INTRAVENOUS
  Filled 2022-05-08: qty 100

## 2022-05-08 MED ORDER — BUPIVACAINE IN DEXTROSE 0.75-8.25 % IT SOLN
INTRATHECAL | Status: DC | PRN
  Administered 2022-05-08: 1.8 mL via INTRATHECAL

## 2022-05-08 MED ORDER — ONDANSETRON HCL 4 MG/2ML IJ SOLN
INTRAMUSCULAR | Status: AC
  Filled 2022-05-08: qty 2

## 2022-05-08 MED ORDER — OXYCODONE HCL 5 MG PO TABS: 5.0000 mg | ORAL_TABLET | Freq: Once | ORAL | Status: DC | PRN

## 2022-05-08 MED ORDER — SODIUM CHLORIDE 0.9 % IV SOLN: INTRAVENOUS | Status: DC

## 2022-05-08 MED ORDER — METHOCARBAMOL 1000 MG/10ML IJ SOLN: 500.0000 mg | Freq: Four times a day (QID) | INTRAVENOUS | Status: DC | PRN

## 2022-05-08 MED ORDER — ACETAMINOPHEN 325 MG PO TABS: 325.0000 mg | ORAL_TABLET | Freq: Four times a day (QID) | ORAL | Status: DC | PRN

## 2022-05-08 MED ORDER — POVIDONE-IODINE 10 % EX SWAB
2.0000 | Freq: Once | CUTANEOUS | Status: AC
  Administered 2022-05-08: 2 via TOPICAL

## 2022-05-08 MED ORDER — OXYCODONE HCL 5 MG PO TABS: 10.0000 mg | ORAL_TABLET | ORAL | Status: DC | PRN

## 2022-05-08 MED ORDER — HYDROMORPHONE HCL 1 MG/ML IJ SOLN
0.5000 mg | INTRAMUSCULAR | Status: DC | PRN
  Administered 2022-05-09 (×2): 1 mg via INTRAVENOUS
  Filled 2022-05-08 (×2): qty 1

## 2022-05-08 MED ORDER — MENTHOL 3 MG MT LOZG: 1.0000 | LOZENGE | OROMUCOSAL | Status: DC | PRN

## 2022-05-08 MED ORDER — ALUM & MAG HYDROXIDE-SIMETH 200-200-20 MG/5ML PO SUSP: 30.0000 mL | ORAL | Status: DC | PRN

## 2022-05-08 MED ORDER — DIPHENHYDRAMINE HCL 12.5 MG/5ML PO ELIX: 12.5000 mg | ORAL_SOLUTION | ORAL | Status: DC | PRN

## 2022-05-08 MED ORDER — STERILE WATER FOR IRRIGATION IR SOLN
Status: DC | PRN
  Administered 2022-05-08: 1000 mL

## 2022-05-08 MED ORDER — DEXAMETHASONE SODIUM PHOSPHATE 10 MG/ML IJ SOLN
INTRAMUSCULAR | Status: DC | PRN
  Administered 2022-05-08: 4 mg via INTRAVENOUS

## 2022-05-08 MED ORDER — OXYCODONE HCL 5 MG/5ML PO SOLN: 5.0000 mg | Freq: Once | ORAL | Status: DC | PRN

## 2022-05-08 MED ORDER — CEFAZOLIN SODIUM-DEXTROSE 2-4 GM/100ML-% IV SOLN
2.0000 g | INTRAVENOUS | Status: AC
  Administered 2022-05-08: 2 g via INTRAVENOUS
  Filled 2022-05-08: qty 100

## 2022-05-08 MED ORDER — PHENYLEPHRINE HCL-NACL 20-0.9 MG/250ML-% IV SOLN
INTRAVENOUS | Status: DC | PRN
  Administered 2022-05-08: 40 ug/min via INTRAVENOUS

## 2022-05-08 MED ORDER — AMISULPRIDE (ANTIEMETIC) 5 MG/2ML IV SOLN: 10.0000 mg | Freq: Once | INTRAVENOUS | Status: DC | PRN

## 2022-05-08 MED ORDER — METOCLOPRAMIDE HCL 5 MG PO TABS: 5.0000 mg | ORAL_TABLET | Freq: Three times a day (TID) | ORAL | Status: DC | PRN

## 2022-05-08 MED ORDER — POLYETHYLENE GLYCOL 3350 17 G PO PACK: 17.0000 g | PACK | Freq: Every day | ORAL | Status: DC | PRN

## 2022-05-08 MED ORDER — SODIUM CHLORIDE 0.9 % IR SOLN
Status: DC | PRN
  Administered 2022-05-08: 1000 mL

## 2022-05-08 MED ORDER — HYDROMORPHONE HCL 1 MG/ML IJ SOLN
0.2500 mg | INTRAMUSCULAR | Status: DC | PRN
  Administered 2022-05-08: 0.5 mg via INTRAVENOUS

## 2022-05-08 MED ORDER — FENTANYL CITRATE (PF) 250 MCG/5ML IJ SOLN
INTRAMUSCULAR | Status: AC
  Filled 2022-05-08: qty 5

## 2022-05-08 MED ORDER — PROMETHAZINE HCL 25 MG/ML IJ SOLN: 6.2500 mg | INTRAMUSCULAR | Status: DC | PRN

## 2022-05-08 MED ORDER — PANTOPRAZOLE SODIUM 40 MG PO TBEC
40.0000 mg | DELAYED_RELEASE_TABLET | Freq: Every day | ORAL | Status: DC
  Administered 2022-05-08 – 2022-05-09 (×2): 40 mg via ORAL
  Filled 2022-05-08 (×2): qty 1

## 2022-05-08 MED ORDER — PHENOL 1.4 % MT LIQD: 1.0000 | OROMUCOSAL | Status: DC | PRN

## 2022-05-08 MED ORDER — DOCUSATE SODIUM 100 MG PO CAPS
100.0000 mg | ORAL_CAPSULE | Freq: Two times a day (BID) | ORAL | Status: DC
  Administered 2022-05-08 – 2022-05-09 (×3): 100 mg via ORAL
  Filled 2022-05-08 (×3): qty 1

## 2022-05-08 MED ORDER — METOCLOPRAMIDE HCL 5 MG/ML IJ SOLN
5.0000 mg | Freq: Three times a day (TID) | INTRAMUSCULAR | Status: DC | PRN
  Administered 2022-05-09: 5 mg via INTRAVENOUS
  Filled 2022-05-08: qty 2

## 2022-05-08 MED ORDER — METHOCARBAMOL 500 MG PO TABS
500.0000 mg | ORAL_TABLET | Freq: Four times a day (QID) | ORAL | Status: DC | PRN
  Administered 2022-05-08 – 2022-05-09 (×2): 500 mg via ORAL
  Filled 2022-05-08 (×2): qty 1

## 2022-05-08 MED ORDER — MIDAZOLAM HCL 2 MG/2ML IJ SOLN
INTRAMUSCULAR | Status: AC
  Filled 2022-05-08: qty 2

## 2022-05-08 MED ORDER — MEPERIDINE HCL 25 MG/ML IJ SOLN: 6.2500 mg | INTRAMUSCULAR | Status: DC | PRN

## 2022-05-08 MED ORDER — ORAL CARE MOUTH RINSE: 15.0000 mL | Freq: Once | OROMUCOSAL | Status: AC

## 2022-05-08 MED ORDER — ASPIRIN 81 MG PO CHEW
81.0000 mg | CHEWABLE_TABLET | Freq: Two times a day (BID) | ORAL | Status: DC
  Administered 2022-05-08 – 2022-05-09 (×2): 81 mg via ORAL
  Filled 2022-05-08 (×2): qty 1

## 2022-05-08 MED ORDER — MIDAZOLAM HCL 2 MG/2ML IJ SOLN
INTRAMUSCULAR | Status: DC | PRN
  Administered 2022-05-08: 2 mg via INTRAVENOUS

## 2022-05-08 MED ORDER — ONDANSETRON HCL 4 MG/2ML IJ SOLN
INTRAMUSCULAR | Status: DC | PRN
  Administered 2022-05-08: 4 mg via INTRAVENOUS

## 2022-05-08 MED ORDER — PROPOFOL 500 MG/50ML IV EMUL
INTRAVENOUS | Status: DC | PRN
  Administered 2022-05-08: 100 ug/kg/min via INTRAVENOUS

## 2022-05-08 MED ORDER — DEXAMETHASONE SODIUM PHOSPHATE 10 MG/ML IJ SOLN
INTRAMUSCULAR | Status: AC
  Filled 2022-05-08: qty 1

## 2022-05-08 MED ORDER — CELECOXIB 200 MG PO CAPS
200.0000 mg | ORAL_CAPSULE | Freq: Once | ORAL | Status: AC
  Administered 2022-05-08: 200 mg via ORAL
  Filled 2022-05-08: qty 1

## 2022-05-08 MED ORDER — 0.9 % SODIUM CHLORIDE (POUR BTL) OPTIME
TOPICAL | Status: DC | PRN
  Administered 2022-05-08: 1000 mL

## 2022-05-08 MED ORDER — LACTATED RINGERS IV SOLN: INTRAVENOUS | Status: DC

## 2022-05-08 MED ORDER — HYDROMORPHONE HCL 1 MG/ML IJ SOLN
INTRAMUSCULAR | Status: AC
  Filled 2022-05-08: qty 1

## 2022-05-08 MED ORDER — CHLORHEXIDINE GLUCONATE 0.12 % MT SOLN
15.0000 mL | Freq: Once | OROMUCOSAL | Status: AC
  Administered 2022-05-08: 15 mL via OROMUCOSAL
  Filled 2022-05-08: qty 15

## 2022-05-08 MED ORDER — FENTANYL CITRATE (PF) 250 MCG/5ML IJ SOLN
INTRAMUSCULAR | Status: DC | PRN
  Administered 2022-05-08: 50 ug via INTRAVENOUS

## 2022-05-08 MED ORDER — OXYCODONE HCL 5 MG PO TABS
5.0000 mg | ORAL_TABLET | ORAL | Status: DC | PRN
  Administered 2022-05-08 – 2022-05-09 (×4): 5 mg via ORAL
  Filled 2022-05-08 (×4): qty 1

## 2022-05-08 MED ORDER — ONDANSETRON HCL 4 MG PO TABS
4.0000 mg | ORAL_TABLET | Freq: Four times a day (QID) | ORAL | Status: DC | PRN
  Administered 2022-05-08 – 2022-05-09 (×2): 4 mg via ORAL
  Filled 2022-05-08 (×2): qty 1

## 2022-05-08 MED ORDER — ONDANSETRON HCL 4 MG/2ML IJ SOLN
4.0000 mg | Freq: Four times a day (QID) | INTRAMUSCULAR | Status: DC | PRN
  Administered 2022-05-08: 4 mg via INTRAVENOUS
  Filled 2022-05-08: qty 2

## 2022-05-08 SURGICAL SUPPLY — 56 items
ACETAB CUP W/GRIPTION 54 (Plate) ×1 IMPLANT
APL SKNCLS STERI-STRIP NONHPOA (GAUZE/BANDAGES/DRESSINGS)
BAG COUNTER SPONGE SURGICOUNT (BAG) ×1 IMPLANT
BAG SPNG CNTER NS LX DISP (BAG) ×1
BENZOIN TINCTURE PRP APPL 2/3 (GAUZE/BANDAGES/DRESSINGS) ×1 IMPLANT
BLADE CLIPPER SURG (BLADE) IMPLANT
BLADE SAW SGTL 18X1.27X75 (BLADE) ×1 IMPLANT
COVER SURGICAL LIGHT HANDLE (MISCELLANEOUS) ×1 IMPLANT
CUP ACETAB W/GRIPTION 54 (Plate) IMPLANT
DRAPE C-ARM 42X72 X-RAY (DRAPES) ×1 IMPLANT
DRAPE STERI IOBAN 125X83 (DRAPES) ×1 IMPLANT
DRAPE U-SHAPE 47X51 STRL (DRAPES) ×3 IMPLANT
DRSG AQUACEL AG ADV 3.5X10 (GAUZE/BANDAGES/DRESSINGS) ×1 IMPLANT
DURAPREP 26ML APPLICATOR (WOUND CARE) ×1 IMPLANT
ELECT BLADE 4.0 EZ CLEAN MEGAD (MISCELLANEOUS) ×1
ELECT BLADE 6.5 EXT (BLADE) IMPLANT
ELECT REM PT RETURN 9FT ADLT (ELECTROSURGICAL) ×1
ELECTRODE BLDE 4.0 EZ CLN MEGD (MISCELLANEOUS) ×1 IMPLANT
ELECTRODE REM PT RTRN 9FT ADLT (ELECTROSURGICAL) ×1 IMPLANT
FACESHIELD WRAPAROUND (MASK) ×3 IMPLANT
FACESHIELD WRAPAROUND OR TEAM (MASK) ×2 IMPLANT
FEM STEM 12/14 TAPER SZ 4 HIP (Orthopedic Implant) ×1 IMPLANT
FEMORAL STEM 12/14 TPR SZ4 HIP (Orthopedic Implant) IMPLANT
GLOVE BIOGEL PI IND STRL 8 (GLOVE) ×2 IMPLANT
GLOVE ECLIPSE 8.0 STRL XLNG CF (GLOVE) ×1 IMPLANT
GLOVE ORTHO TXT STRL SZ7.5 (GLOVE) ×2 IMPLANT
GOWN STRL REUS W/ TWL LRG LVL3 (GOWN DISPOSABLE) ×2 IMPLANT
GOWN STRL REUS W/ TWL XL LVL3 (GOWN DISPOSABLE) ×2 IMPLANT
GOWN STRL REUS W/TWL LRG LVL3 (GOWN DISPOSABLE) ×2
GOWN STRL REUS W/TWL XL LVL3 (GOWN DISPOSABLE) ×2
HANDPIECE INTERPULSE COAX TIP (DISPOSABLE) ×1
HEAD CERAMIC 36 PLUS5 (Hips) IMPLANT
KIT BASIN OR (CUSTOM PROCEDURE TRAY) ×1 IMPLANT
KIT TURNOVER KIT B (KITS) ×1 IMPLANT
LINER NEUTRAL 54X36MM PLUS 4 (Hips) IMPLANT
MANIFOLD NEPTUNE II (INSTRUMENTS) ×1 IMPLANT
NS IRRIG 1000ML POUR BTL (IV SOLUTION) ×1 IMPLANT
PACK TOTAL JOINT (CUSTOM PROCEDURE TRAY) ×1 IMPLANT
PAD ARMBOARD 7.5X6 YLW CONV (MISCELLANEOUS) ×1 IMPLANT
SET HNDPC FAN SPRY TIP SCT (DISPOSABLE) ×1 IMPLANT
STAPLER VISISTAT 35W (STAPLE) IMPLANT
STRIP CLOSURE SKIN 1/2X4 (GAUZE/BANDAGES/DRESSINGS) ×2 IMPLANT
SUT ETHIBOND NAB CT1 #1 30IN (SUTURE) ×1 IMPLANT
SUT MNCRL AB 4-0 PS2 18 (SUTURE) IMPLANT
SUT VIC AB 0 CT1 27 (SUTURE) ×1
SUT VIC AB 0 CT1 27XBRD ANBCTR (SUTURE) ×1 IMPLANT
SUT VIC AB 1 CT1 27 (SUTURE) ×1
SUT VIC AB 1 CT1 27XBRD ANBCTR (SUTURE) ×1 IMPLANT
SUT VIC AB 2-0 CT1 27 (SUTURE) ×1
SUT VIC AB 2-0 CT1 TAPERPNT 27 (SUTURE) ×1 IMPLANT
TOWEL GREEN STERILE (TOWEL DISPOSABLE) ×1 IMPLANT
TOWEL GREEN STERILE FF (TOWEL DISPOSABLE) ×1 IMPLANT
TRAY CATH 16FR W/PLASTIC CATH (SET/KITS/TRAYS/PACK) IMPLANT
TRAY FOLEY W/BAG SLVR 16FR (SET/KITS/TRAYS/PACK) ×1
TRAY FOLEY W/BAG SLVR 16FR ST (SET/KITS/TRAYS/PACK) IMPLANT
WATER STERILE IRR 1000ML POUR (IV SOLUTION) ×2 IMPLANT

## 2022-05-08 NOTE — Op Note (Signed)
Operative Note  Date of operation: 05/08/2022 Preoperative diagnosis: Right hip primary osteoarthritis Postoperative diagnosis: Same  Procedure: Right direct anterior total hip arthroplasty  Implants: DePuy sector GRIPTION acetabular component size 54, 36+4 polyethylene liner, size 4 Actis femoral component with high offset, 36+5 ceramic head ball  Surgeon: Lind Guest. Ninfa Linden, MD Assistant: Benita Stabile, PA-C  Anesthesia: Spinal EBL: 100 cc Antibiotics: 2 g IV Ancef Complications: None  Indications: The patient is a 52 year old female with unfortunate debilitating arthritis involving her right hip.  This is been well-documented with x-ray findings and clinical exam findings.  At this point her right hip pain is daily and it is detrimentally affecting her mobility, her quality of life and actives day living.  It has been slowly getting worse for over a year now and she does wish to proceed with a total hip arthroplasty given the failure conservative treatment.  We had a long and thorough discussion about the risk of acute blood loss anemia, nerve surgery, fracture, infection, DVT, dislocation, implant failure, leg length differences and wound healing issues.  We talked about her goals hopefully being decreased pain, improve mobility and improve quality of life.  Procedure description: After informed consent was obtained and the appropriate right hip was marked, the patient was brought to the operating room and set up on her stretcher where spinal anesthesia was obtained.  She was then laid in supine position on stretcher and a Foley catheter was placed.  I was able to get a good assessment of her leg lengths and her right side is just definitely shorter than the left.  Traction boots were then placed on both her feet and she was placed supine on the Hana fracture table with a perineal post in place and no traction applied.  We then assessed again her right hip and pelvis radiographically.   Her right hip was then prepped and draped with DuraPrep and sterile drapes.  A timeout was called and she identified as the correct patient the correct right hip.  An incision was then made just inferior and posterior to the ASIS and carried slightly obliquely down the leg.  Dissection was carried down to the tensor fascia lata muscle and the tensor fascia was then divided longitudinally to proceed with a direct anterior approach the hip.  Circumflex vessels were identified and cauterized and hip capsule was identified and opened up in L-type format finding a moderate joint effusion and significant evidence of previous femoral acetabular impingement with osteophytes around the lateral femoral neck and head junction.  Cobra retractors were placed around the medial lateral femoral neck and a femoral neck cut was made with an oscillating saw just proximal to the lesser trochanter and completed with an osteotome.  A corkscrew guide was placed in the femoral head and the femoral head was removed in its entirety and surprisingly was a bigger femoral head but significant cartilage loss over a wide area.  A bent Hohmann was placed over the medial acetabular rim and remnants of the acetabular labrum and other debris removed.  We then began reaming under direct visualization and direct fluoroscopy from a size 43 reamer and stepwise increments going up to a size 53 reamer with all reamers placed under direct visualization elastomer is placed in direct fluoroscopy in order to gain the depth of reaming, the inclination and anteversion.  The real DePuy sector GRIPTION acetabular component size 54 was then placed without difficulty and a 36+4 polythene liner was placed within that size 54  acetabular component.  Attention was then turned to the femur.  With the right leg externally rotated to 120 degrees, extended and adducted, a Mueller retractor was placed by the medial calcar and a long bent Hohmann was placed behind the  greater trochanter.  The lateral joint capsule was released and a box cutting osteotome was used into the femoral canal.  Broaching was then initiated from a size 0 broach going and stepwise increments to a size 4 broach.  With a size 4 broach in place we trialed I high offset femoral neck and a 36+1.5 trial hip ball.  This reduced in the pelvis and we felt like we needed just a little bit more leg length assessing it radiographically and clinically.  We dislocated the hip and remove the trial components.  We placed the real Actis femoral component with high offset size 4 and the real 36+5 ceramic hip bone again reduces in acetabulum and replaced a leg length, offset, range of motion and stability assessed both mechanically and radiographically.  The soft tissue was then irrigated with normal saline solution.  The joint capsule was closed interrupted #1 Ethibond suture followed by #1 Vicryl close the tensor fascia.  0 Vicryl was used to close the deep tissue and 2-0 Vicryl was used to close subcutaneous tissue.  The subcuticular 4-0 Monocryl stitch and Steri-Strips were applied to the skin.  An Aquacel dressing was placed.  She was taken off of the Hana table and taken to the recovery room in stable condition with all final counts being correct and no complications noted.  Benita Stabile PA-C did assist during the entire case from beginning to end and his assistance was medically necessary and crucial for soft tissue management and retraction, helping guide implant placement and a layered closure of the wound.

## 2022-05-08 NOTE — Transfer of Care (Signed)
Immediate Anesthesia Transfer of Care Note  Patient: Theresa Nelson  Procedure(s) Performed: RIGHT TOTAL HIP ARTHROPLASTY ANTERIOR APPROACH (Right: Hip)  Patient Location: PACU  Anesthesia Type:Spinal  Level of Consciousness: awake, alert , and oriented  Airway & Oxygen Therapy: Patient Spontanous Breathing  Post-op Assessment: Report given to RN and Post -op Vital signs reviewed and stable  Post vital signs: Reviewed and stable  Last Vitals:  Vitals Value Taken Time  BP 86/50 05/08/22 1116  Temp    Pulse 65 05/08/22 1120  Resp 15 05/08/22 1120  SpO2 97 % 05/08/22 1120  Vitals shown include unvalidated device data.  Last Pain:  Vitals:   05/08/22 0815  TempSrc:   PainSc: 5          Complications: No notable events documented.

## 2022-05-08 NOTE — Interval H&P Note (Signed)
History and Physical Interval Note: The patient understands that she is here today for a right total hip replacement to treat her severe right hip arthritis.  There has been no acute or interval change in her medical status.  See H&P.  The risks and benefits of surgery been explained in detail and informed consent is obtained.  The right operative hip has been marked.  05/08/2022 9:00 AM  Theresa Nelson  has presented today for surgery, with the diagnosis of ENDSTAGE OSTEOARTHRITIS RIGHT HIP.  The various methods of treatment have been discussed with the patient and family. After consideration of risks, benefits and other options for treatment, the patient has consented to  Procedure(s): RIGHT TOTAL HIP ARTHROPLASTY ANTERIOR APPROACH (Right) as a surgical intervention.  The patient's history has been reviewed, patient examined, no change in status, stable for surgery.  I have reviewed the patient's chart and labs.  Questions were answered to the patient's satisfaction.     Mcarthur Rossetti

## 2022-05-08 NOTE — Anesthesia Procedure Notes (Signed)
Procedure Name: MAC Date/Time: 05/08/2022 9:50 AM  Performed by: Kyung Rudd, CRNAPre-anesthesia Checklist: Patient identified, Emergency Drugs available, Suction available, Patient being monitored and Timeout performed Patient Re-evaluated:Patient Re-evaluated prior to induction Oxygen Delivery Method: Simple face mask Induction Type: IV induction Placement Confirmation: positive ETCO2 Dental Injury: Teeth and Oropharynx as per pre-operative assessment

## 2022-05-08 NOTE — Anesthesia Procedure Notes (Signed)
Spinal  Patient location during procedure: OR Start time: 05/08/2022 9:44 AM End time: 05/08/2022 9:48 AM Reason for block: surgical anesthesia Staffing Performed: anesthesiologist  Anesthesiologist: Nolon Nations, MD Performed by: Nolon Nations, MD Authorized by: Nolon Nations, MD   Preanesthetic Checklist Completed: patient identified, IV checked, site marked, risks and benefits discussed, surgical consent, monitors and equipment checked, pre-op evaluation and timeout performed Spinal Block Patient position: sitting Prep: DuraPrep and site prepped and draped Patient monitoring: heart rate, continuous pulse ox and blood pressure Approach: midline Location: L2-3 Injection technique: single-shot Needle Needle type: Spinocan  Needle gauge: 25 G Needle length: 9 cm Additional Notes Expiration date of kit checked and confirmed. Patient tolerated procedure well, without complications.

## 2022-05-08 NOTE — Anesthesia Preprocedure Evaluation (Addendum)
Anesthesia Evaluation  Patient identified by MRN, date of birth, ID band Patient awake    Reviewed: Allergy & Precautions, H&P , NPO status , Patient's Chart, lab work & pertinent test results  History of Anesthesia Complications (+) PONV and history of anesthetic complications  Airway Mallampati: II  TM Distance: >3 FB Neck ROM: Full    Dental  (+) Dental Advisory Given, Teeth Intact   Pulmonary neg pulmonary ROS   Pulmonary exam normal breath sounds clear to auscultation       Cardiovascular negative cardio ROS Normal cardiovascular exam Rhythm:Regular Rate:Normal     Neuro/Psych  PSYCHIATRIC DISORDERS Anxiety     negative neurological ROS  negative psych ROS   GI/Hepatic negative GI ROS, Neg liver ROS,,,  Endo/Other  negative endocrine ROS    Renal/GU negative Renal ROS     Musculoskeletal negative musculoskeletal ROS (+) Arthritis ,    Abdominal   Peds  Hematology negative hematology ROS (+)   Anesthesia Other Findings   Reproductive/Obstetrics negative OB ROS                             Anesthesia Physical Anesthesia Plan  ASA: 2  Anesthesia Plan: Spinal   Post-op Pain Management: Tylenol PO (pre-op)* and Celebrex PO (pre-op)*   Induction: Intravenous  PONV Risk Score and Plan: 4 or greater and Ondansetron, Treatment may vary due to age or medical condition, Midazolam and Propofol infusion  Airway Management Planned: Natural Airway  Additional Equipment: None  Intra-op Plan:   Post-operative Plan:   Informed Consent: I have reviewed the patients History and Physical, chart, labs and discussed the procedure including the risks, benefits and alternatives for the proposed anesthesia with the patient or authorized representative who has indicated his/her understanding and acceptance.     Dental advisory given  Plan Discussed with: CRNA  Anesthesia Plan Comments:          Anesthesia Quick Evaluation

## 2022-05-08 NOTE — Evaluation (Signed)
Physical Therapy Evaluation Patient Details Name: Theresa Nelson MRN: [id number] DOB: 26-Jan-1971 Today's Date: 05/08/2022  History of Present Illness  Patient is a 52 y/o female who presents for right THA, direct anterior approach 05/08/22.  Clinical Impression  Patient presents with pain and post surgical deficits s/p above surgery. Pt is independent at baseline and works PTA. Today, pt tolerated bed mobility, transfers and gait training with Min guard-supervision for safety using RW. Will have support from friend and family at home. Education re: elevation, positioning, activity recommendations, there ex, RW use etc. Will provide HEP handout next session. Will follow acutely to maximize independence and mobility prior to return home. Will likely be ready to d/c after AM PT session.     Recommendations for follow up therapy are one component of a multi-disciplinary discharge planning process, led by the attending physician.  Recommendations may be updated based on patient status, additional functional criteria and insurance authorization.  Follow Up Recommendations Follow physician's recommendations for discharge plan and follow up therapies      Assistance Recommended at Discharge PRN  Patient can return home with the following  Assist for transportation;Assistance with cooking/housework;A little help with walking and/or transfers;A little help with bathing/dressing/bathroom    Equipment Recommendations None recommended by PT  Recommendations for Other Services       Functional Status Assessment Patient has had a recent decline in their functional status and demonstrates the ability to make significant improvements in function in a reasonable and predictable amount of time.     Precautions / Restrictions Precautions Precautions: Fall Restrictions Weight Bearing Restrictions: Yes RLE Weight Bearing: Weight bearing as tolerated      Mobility  Bed Mobility Overal bed mobility:  Modified Independent             General bed mobility comments: Able to get to EOB with increased time, no assist needed. HOB elevated, no dizziness.    Transfers Overall transfer level: Needs assistance Equipment used: Rolling walker (2 wheels) Transfers: Sit to/from Stand Sit to Stand: Min guard           General transfer comment: Min guard for safety. Stood from [employer], cues for hand placement/technique. Transferred to chair post ambulation.    Ambulation/Gait Ambulation/Gait assistance: Supervision Gait Distance (Feet): 200 Feet Assistive device: Rolling walker (2 wheels) Gait Pattern/deviations: Step-through pattern, Decreased stance time - right, Decreased step length - left, Trunk flexed Gait velocity: decreased Gait velocity interpretation: 1.31 - 2.62 ft/sec, indicative of limited community ambulator   General Gait Details: Slow, antalgic like gait with use of RW. Flexed posture.  Stairs            Wheelchair Mobility    Modified Rankin (Stroke Patients Only)       Balance Overall balance assessment: Needs assistance Sitting-balance support: Feet supported, No upper extremity supported Sitting balance-Leahy Scale: Good     Standing balance support: During functional activity, Bilateral upper extremity supported Standing balance-Leahy Scale: Poor Standing balance comment: Requires UE support                             Pertinent Vitals/Pain Pain Assessment Pain Assessment: 0-10 Pain Score: 4  Pain Location: right hip Pain Descriptors / Indicators: Sore, Operative site guarding Pain Intervention(s): Premedicated before session, Monitored during session, Repositioned, Ice applied    Home Living Family/patient expects to be discharged to:: Private residence Living Arrangements:  (daughter and friend) Available Help  at Discharge: Friend(s);Available 24 hours/day Type of Home: House Home Access: Level entry       Home Layout:  One level Home Equipment: Conservation officer, nature (2 wheels);BSC/3in1;Rollator (4 wheels)      Prior Function Prior Level of Function : Independent/Modified Independent             Mobility Comments: Independent, drives. Works as Freight forwarder at [employer] in [employer], loves to hang out with her grandchildren ADLs Comments: Independent     Hand Dominance   Dominant Hand: Right    Extremity/Trunk Assessment   Upper Extremity Assessment Upper Extremity Assessment: Defer to OT evaluation    Lower Extremity Assessment Lower Extremity Assessment: RLE deficits/detail RLE Deficits / Details: Grossly functional with post op deficits, sensation WFls RLE Sensation: WNL    Cervical / Trunk Assessment Cervical / Trunk Assessment: Normal  Communication   Communication: No difficulties  Cognition Arousal/Alertness: Awake/alert Behavior During Therapy: WFL for tasks assessed/performed Overall Cognitive Status: Within Functional Limits for tasks assessed                                          General Comments General comments (skin integrity, edema, etc.): Friend and daughter present during session. Bandage- clean dry and intact    Exercises Total Joint Exercises Ankle Circles/Pumps: AROM, Both, 10 reps, Supine Quad Sets: AROM, Both, 10 reps, Seated Gluteal Sets: AROM, Both, 5 reps, Seated   Assessment/Plan    PT Assessment Patient needs continued PT services  PT Problem List Decreased range of motion;Decreased strength;Decreased mobility;Pain;Decreased knowledge of use of DME;Decreased skin integrity       PT Treatment Interventions Therapeutic activities;Gait training;Patient/family education;Therapeutic exercise;Balance training;Functional mobility training;DME instruction    PT Goals (Current goals can be found in the Care Plan section)  Acute Rehab PT Goals Patient Stated Goal: to play with my grandkids PT Goal Formulation: With patient Time For Goal  Achievement: 05/22/22 Potential to Achieve Goals: Good    Frequency 7X/week     Co-evaluation               AM-PAC PT "6 Clicks" Mobility  Outcome Measure Help needed turning from your back to your side while in a flat bed without using bedrails?: None Help needed moving from lying on your back to sitting on the side of a flat bed without using bedrails?: None Help needed moving to and from a bed to a chair (including a wheelchair)?: A Little Help needed standing up from a chair using your arms (e.g., wheelchair or bedside chair)?: A Little Help needed to walk in hospital room?: A Little Help needed climbing 3-5 steps with a railing? : A Little 6 Click Score: 20    End of Session   Activity Tolerance: Patient tolerated treatment well Patient left: in chair;with call bell/phone within reach;with family/visitor present Nurse Communication: Mobility status PT Visit Diagnosis: Pain;Difficulty in walking, not elsewhere classified (R26.2) Pain - Right/Left: Right Pain - part of body: Hip    Time: [id number] PT Time Calculation (min) (ACUTE ONLY): 21 min   Charges:   PT Evaluation $PT Eval Low Complexity: 1 Low          Marisa Severin, PT, DPT Acute Rehabilitation Services Secure chat preferred Office [phone]     Lacie Draft 05/08/2022, 3:42 PM

## 2022-05-09 DIAGNOSIS — M1611 Unilateral primary osteoarthritis, right hip: Secondary | ICD-10-CM | POA: Diagnosis not present

## 2022-05-09 LAB — BASIC METABOLIC PANEL
Anion gap: 7 (ref 5–15)
BUN: 11 mg/dL (ref 6–20)
CO2: 26 mmol/L (ref 22–32)
Calcium: 8.4 mg/dL — ABNORMAL LOW (ref 8.9–10.3)
Chloride: 101 mmol/L (ref 98–111)
Creatinine, Ser: 0.64 mg/dL (ref 0.44–1.00)
GFR, Estimated: >60 mL/min (ref >60)
Glucose, Bld: 122 mg/dL — ABNORMAL HIGH (ref 70–99)
Potassium: 4.2 mmol/L (ref 3.5–5.1)
Sodium: 134 mmol/L — ABNORMAL LOW (ref 135–145)

## 2022-05-09 LAB — CBC
HCT: 34.3 % — ABNORMAL LOW (ref 36.0–46.0)
Hemoglobin: 11.4 g/dL — ABNORMAL LOW (ref 12.0–15.0)
MCH: 29.5 pg (ref 26.0–34.0)
MCHC: 33.2 g/dL (ref 30.0–36.0)
MCV: 88.9 fL (ref 80.0–100.0)
Platelets: 275 10*3/uL (ref 150–400)
RBC: 3.86 MIL/uL — ABNORMAL LOW (ref 3.87–5.11)
RDW: 12.2 % (ref 11.5–15.5)
WBC: 12.8 10*3/uL — ABNORMAL HIGH (ref 4.0–10.5)
nRBC: 0 % (ref 0.0–0.2)

## 2022-05-09 MED ORDER — OXYCODONE HCL 5 MG PO TABS: 5.0000 mg | ORAL_TABLET | ORAL | 0 refills | Status: AC | PRN

## 2022-05-09 MED ORDER — METHOCARBAMOL 500 MG PO TABS: 500.0000 mg | ORAL_TABLET | Freq: Four times a day (QID) | ORAL | 1 refills | Status: DC | PRN

## 2022-05-09 MED ORDER — ASPIRIN 81 MG PO CHEW: 81.0000 mg | CHEWABLE_TABLET | Freq: Two times a day (BID) | ORAL | 0 refills | Status: AC

## 2022-05-09 MED ORDER — ONDANSETRON 4 MG PO TBDP: 4.0000 mg | ORAL_TABLET | Freq: Three times a day (TID) | ORAL | 0 refills | Status: AC | PRN

## 2022-05-09 NOTE — Progress Notes (Signed)
Physical Therapy Treatment Patient Details Name: Mylo Kowalke MRN: [id number] DOB: 1970/05/01 Today's Date: 05/09/2022   History of Present Illness Patient is a 52 y/o female who presents for right THA, direct anterior approach 05/08/22.    PT Comments    Pt was received sitting in recliner and agreeable to session. Pt reported nausea from recent pain medicine and experienced emesis x2 during session. Pt did not report dizziness with position changes this session and was able to tolerate increased gait distance progressing to supervision for safety. Pt was able to stand at the sink and brush teeth with no UE support and then transfer to raised toilet to attempt to use the bathroom with supervision. Pt's friend, who will be providing assist at discharge, was present for instructions on safe guarding techniques. Anticipate pt and caregivers will be able to safely manage pt's mobility needs at home.    Recommendations for follow up therapy are one component of a multi-disciplinary discharge planning process, led by the attending physician.  Recommendations may be updated based on patient status, additional functional criteria and insurance authorization.  Follow Up Recommendations  Follow physician's recommendations for discharge plan and follow up therapies     Assistance Recommended at Discharge PRN  Patient can return home with the following Assist for transportation;Assistance with cooking/housework;A little help with walking and/or transfers;A little help with bathing/dressing/bathroom   Equipment Recommendations  None recommended by PT    Recommendations for Other Services       Precautions / Restrictions Precautions Precautions: Fall Restrictions Weight Bearing Restrictions: Yes RLE Weight Bearing: Weight bearing as tolerated     Mobility  Bed Mobility Overal bed mobility: Modified Independent             General bed mobility comments: pt sitting in chair upon arrival     Transfers Overall transfer level: Needs assistance Equipment used: Rolling walker (2 wheels) Transfers: Sit to/from Stand Sit to Stand: Min guard           General transfer comment: Min guard for safety and cues for RLE placement to reduce pain    Ambulation/Gait Ambulation/Gait assistance: Min guard, Supervision Gait Distance (Feet): 65 Feet Assistive device: Rolling walker (2 wheels) Gait Pattern/deviations: Step-to pattern, Antalgic, Trunk flexed, Decreased stance time - right, Decreased step length - left Gait velocity: decreased   Pre-gait activities: Weight shifting at EOB General Gait Details: Slow, antalgic gait with cues for sequencing with RW to reduce pain. Initially min guard for safety due to pain progressing to supervision. 1 seated recovery break for pt to use the bathroom        Balance Overall balance assessment: Needs assistance Sitting-balance support: Feet supported, No upper extremity supported Sitting balance-Leahy Scale: Good Sitting balance - Comments: sitting in recliner   Standing balance support: During functional activity, Bilateral upper extremity supported, Reliant on assistive device for balance Standing balance-Leahy Scale: Poor Standing balance comment: RW support                            Cognition Arousal/Alertness: Awake/alert Behavior During Therapy: WFL for tasks assessed/performed Overall Cognitive Status: Within Functional Limits for tasks assessed                                          Exercises Total Joint Exercises Ankle Circles/Pumps: AROM, Both, 10 reps,  Supine Quad Sets: AROM, Both, Seated, 5 reps Short Arc Quad: AROM, Right, 5 reps, Supine Heel Slides: Supine, Right, 5 reps, AAROM Hip ABduction/ADduction: AROM, Supine, Right, 5 reps Long Arc Quad: Seated, Right, 5 reps, AAROM    General Comments General comments (skin integrity, edema, etc.): Pt reported nausea from pain medicine  when sitting up and experienced emesis x2 during session. No reports of dizziness during mobility.      Pertinent Vitals/Pain Pain Assessment Pain Assessment: Faces Pain Score: 5  Faces Pain Scale: Hurts even more Pain Location: right hip Pain Descriptors / Indicators: Sore, Operative site guarding, Shooting Pain Intervention(s): Limited activity within patient's tolerance, Monitored during session, Ice applied, Repositioned     PT Goals (current goals can now be found in the care plan section) Acute Rehab PT Goals Patient Stated Goal: to play with my grandkids PT Goal Formulation: With patient Time For Goal Achievement: 05/22/22 Potential to Achieve Goals: Good Progress towards PT goals: Progressing toward goals    Frequency    7X/week      PT Plan Current plan remains appropriate       AM-PAC PT "6 Clicks" Mobility   Outcome Measure  Help needed turning from your back to your side while in a flat bed without using bedrails?: None Help needed moving from lying on your back to sitting on the side of a flat bed without using bedrails?: None Help needed moving to and from a bed to a chair (including a wheelchair)?: A Little Help needed standing up from a chair using your arms (e.g., wheelchair or bedside chair)?: A Little Help needed to walk in hospital room?: A Little Help needed climbing 3-5 steps with a railing? : A Little 6 Click Score: 20    End of Session Equipment Utilized During Treatment: Gait belt Activity Tolerance: Patient tolerated treatment well Patient left: in chair;with call bell/phone within reach;with family/visitor present Nurse Communication: Mobility status PT Visit Diagnosis: Pain;Difficulty in walking, not elsewhere classified (R26.2) Pain - Right/Left: Right Pain - part of body: Hip     Time: 1138-1206 PT Time Calculation (min) (ACUTE ONLY): 28 min  Charges:  $Gait Training: 23-37 mins $Therapeutic Exercise: 8-22 mins                      Michelle Nasuti, PTA Acute Rehabilitation Services Secure Chat Preferred  [phone]    Michelle Nasuti 05/09/2022, 12:20 PM

## 2022-05-09 NOTE — Discharge Summary (Signed)
Patient ID: [name] MRN: [id number] DOB/AGE: Mar 28, 1970 52 y.o.  Admit date: 05/08/2022 Discharge date: 05/09/2022  Admission Diagnoses:  Principal Problem:   Unilateral primary osteoarthritis, right hip Active Problems:   Status post total replacement of right hip   Discharge Diagnoses:  Same  Past Medical History:  Diagnosis Date   Arthritis    PONV (postoperative nausea and vomiting)     Surgeries: Procedure(s): RIGHT TOTAL HIP ARTHROPLASTY ANTERIOR APPROACH on 05/08/2022   Consultants:   Discharged Condition: Improved  Hospital Course: Theresa Nelson is an 52 y.o. female who was admitted 05/08/2022 for operative treatment ofUnilateral primary osteoarthritis, right hip. Patient has severe unremitting pain that affects sleep, daily activities, and work/hobbies. After pre-op clearance the patient was taken to the operating room on 05/08/2022 and underwent  Procedure(s): RIGHT TOTAL HIP ARTHROPLASTY ANTERIOR APPROACH.    Patient was given perioperative antibiotics:  Anti-infectives (From admission, onward)    Start     Dose/Rate Route Frequency Ordered Stop   05/08/22 1330  ceFAZolin (ANCEF) IVPB 1 g/50 mL premix        1 g 100 mL/hr over 30 Minutes Intravenous Every 6 hours 05/08/22 1319 05/08/22 2227   05/08/22 0830  ceFAZolin (ANCEF) IVPB 2g/100 mL premix        2 g 200 mL/hr over 30 Minutes Intravenous On call to O.R. 05/08/22 0746 05/08/22 1000        Patient was given sequential compression devices, early ambulation, and chemoprophylaxis to prevent DVT.  Patient benefited maximally from hospital stay and there were no complications.    Recent vital signs: Patient Vitals for the past 24 hrs:  BP Temp Temp src Pulse Resp SpO2  05/09/22 0858 (!) 100/47 97.6 F (36.4 C) Oral 80 16 100 %  05/09/22 0400 (!) 90/57 99.3 F (37.4 C) Oral -- 16 97 %  05/09/22 0000 93/62 98.4 F (36.9 C) Oral 75 15 100 %  05/08/22 2100 (!) 93/52 98.2 F (36.8 C) Oral 77 16 98 %   05/08/22 1348 110/72 (!) 97.5 F (36.4 C) Oral 64 18 100 %  05/08/22 1330 99/65 97.6 F (36.4 C) -- 64 13 97 %     Recent laboratory studies:  Recent Labs    05/09/22 0216  WBC 12.8*  HGB 11.4*  HCT 34.3*  PLT 275  NA 134*  K 4.2  CL 101  CO2 26  BUN 11  CREATININE 0.64  GLUCOSE 122*  CALCIUM 8.4*     Discharge Medications:   Allergies as of 05/09/2022   No Known Allergies      Medication List     STOP taking these medications    diclofenac 75 MG EC tablet Commonly known as: VOLTAREN   tiZANidine 4 MG tablet Commonly known as: ZANAFLEX       TAKE these medications    aspirin 81 MG chewable tablet Chew 1 tablet (81 mg total) by mouth 2 (two) times daily.   methocarbamol 500 MG tablet Commonly known as: ROBAXIN Take 1 tablet (500 mg total) by mouth every 6 (six) hours as needed for muscle spasms.   ondansetron 4 MG disintegrating tablet Commonly known as: ZOFRAN-ODT Take 1 tablet (4 mg total) by mouth every 8 (eight) hours as needed for nausea or vomiting.   oxyCODONE 5 MG immediate release tablet Commonly known as: Oxy IR/ROXICODONE Take 1-2 tablets (5-10 mg total) by mouth every 4 (four) hours as needed for moderate pain (pain score 4-6).  Durable Medical Equipment  (From admission, onward)           Start     Ordered   05/08/22 1320  DME Walker rolling  Once       Question Answer Comment  Walker: With 5 Inch Wheels   Patient needs a walker to treat with the following condition Status post total replacement of right hip      05/08/22 1319   05/08/22 1320  DME 3 n 1  Once        05/08/22 1319            Diagnostic Studies: DG Pelvis Portable  Result Date: 05/08/2022 CLINICAL DATA:  Postoperative.  Total right hip arthroplasty. EXAM: PORTABLE PELVIS 1-2 VIEWS COMPARISON:  Pelvis and right hip radiographs 07/28/2015 FINDINGS: Interval total right hip arthroplasty. No perihardware lucency is seen on single  frontal view. Expected postoperative subcutaneous air. The left femoroacetabular joint space is maintained. The bilateral sacroiliac and the pubic symphysis joint spaces are maintained. No acute fracture or dislocation. IMPRESSION: Interval total right hip arthroplasty without complicating features. Electronically Signed   By: Yvonne Kendall M.D.   On: 05/08/2022 12:10   DG HIP UNILAT WITH PELVIS 1V RIGHT  Result Date: 05/08/2022 CLINICAL DATA:  [id number] Surgery, elective [id number] EXAM: DG HIP (WITH OR WITHOUT PELVIS) 1V RIGHT COMPARISON:  07/28/2015 radiograph FINDINGS: Intraoperative images during right hip arthroplasty. Normal alignment. No evidence of immediate complication. Expected soft tissue changes. IMPRESSION: Intraoperative images during right hip arthroplasty. Normal alignment. No evidence of immediate complication. Electronically Signed   By: Maurine Simmering M.D.   On: 05/08/2022 11:33   DG C-Arm 1-60 Min-No Report  Result Date: 05/08/2022 Fluoroscopy was utilized by the requesting physician.  No radiographic interpretation.    Disposition: Discharge disposition: 01-Home or Self Care          Follow-up Information     Mcarthur Rossetti, MD Follow up in 2 week(s).   Specialty: Orthopedic Surgery Contact information: [address] [place] [address] [phone]                  Signed: Mcarthur Rossetti 05/09/2022, 1:28 PM

## 2022-05-09 NOTE — Discharge Instructions (Signed)

## 2022-05-09 NOTE — Anesthesia Postprocedure Evaluation (Signed)
Anesthesia Post Note  Patient: Theresa Nelson  Procedure(s) Performed: RIGHT TOTAL HIP ARTHROPLASTY ANTERIOR APPROACH (Right: Hip)     Patient location during evaluation: PACU Anesthesia Type: Spinal Level of consciousness: awake and alert Pain management: pain level controlled Vital Signs Assessment: post-procedure vital signs reviewed and stable Respiratory status: spontaneous breathing Cardiovascular status: stable Anesthetic complications: no   No notable events documented.  Last Vitals:  Vitals:   05/09/22 0400 05/09/22 0858  BP: (!) 90/57 (!) 100/47  Pulse:  80  Resp: 16 16  Temp: 37.4 C 36.4 C  SpO2: 97% 100%    Last Pain:  Vitals:   05/09/22 0858  TempSrc: Oral  PainSc:                  Nolon Nations

## 2022-05-09 NOTE — Progress Notes (Signed)
Discharge instructions were given to the pt. All questions are answered.

## 2022-05-09 NOTE — TOC Transition Note (Signed)
Transition of Care [facility]) - CM/SW Discharge Note   Patient Details  Name: Theresa Nelson MRN: [id number] Date of Birth: [birth year]  [facility] CM/SW Contact:  Sharin Mons, RN Phone Number: 05/09/2022, 12:45 PM   Clinical Narrative:    Patient will DC to: home Anticipated DC date: 05/12/2022 Family notified: yes Transport by: car          -  s/p right THA , 04/2022 Per MD patient ready for DC today. RN, and patient aware of DC. Pt lives alone. Pt states family and friends to assist with care once d/c. Pt without DME needs. States has RW and [facility] @ home. Referral made with [facility] for home health PT services, authorization pending. NCM to make pt aware of determination once received.  Pt to f/u with Dr. Trevor Mace office if home health not approved and request outpatient PT referral. PT without RX med concerns. Post hospital f/u  noted on AVS.  Friend to provide transportation to home.  Pt's cell # [phone]  RNCM will sign off for now as intervention is no longer needed. Please consult [place] again if new needs arise.    Final next level of care: [place] Barriers to Discharge: No Barriers Identified   Patient Goals and CMS Choice   Choice offered to / list presented to : Patient  Discharge Placement                         Discharge Plan and Services Additional resources added to the After Visit Summary for                            [facility] Arranged: PT HH Agency: [facility] Date [place]: 05/09/22 Time [place]: 1244 Representative spoke with at [place]: [place] Determinants of Health ([place]) Interventions [place]: No Food Insecurity (01/19/2022)  Housing: Low Risk  (01/19/2022)  Transportation Needs: No Transportation Needs (01/19/2022)  Utilities: Not At Risk (01/19/2022)  Alcohol Screen: Low Risk  (01/19/2022)  Depression (PHQ2-9): Medium  Risk (01/19/2022)  Financial Resource Strain: Low Risk  (01/19/2022)  Physical Activity: Inactive (01/19/2022)  Social Connections: Moderately Integrated (01/19/2022)  Stress: Stress Concern Present (01/19/2022)  Tobacco Use: Low Risk  (05/08/2022)     Readmission Risk Interventions     No data to display

## 2022-05-09 NOTE — Progress Notes (Signed)
Subjective: 1 Day Post-Op Procedure(s) (LRB): RIGHT TOTAL HIP ARTHROPLASTY ANTERIOR APPROACH (Right) Patient reports pain as moderate.    Objective: Vital signs in last 24 hours: Temp:  [97.5 F (36.4 C)-99.3 F (37.4 C)] 99.3 F (37.4 C) (02/21 0400) Pulse Rate:  [55-91] 75 (02/21 0000) Resp:  [13-20] 16 (02/21 0400) BP: (86-110)/(50-76) 90/57 (02/21 0400) SpO2:  [96 %-100 %] 97 % (02/21 0400) Weight:  [60 kg] 60 kg (02/20 0756)  Intake/Output from previous day: 02/20 0701 - 02/21 0700 In: 970 [P.O.:120; I.V.:600; IV Piggyback:250] Out: 725 [Urine:625; Blood:100] Intake/Output this shift: No intake/output data recorded.  Recent Labs    05/09/22 0216  HGB 11.4*   Recent Labs    05/09/22 0216  WBC 12.8*  RBC 3.86*  HCT 34.3*  PLT 275   Recent Labs    05/09/22 0216  NA 134*  K 4.2  CL 101  CO2 26  BUN 11  CREATININE 0.64  GLUCOSE 122*  CALCIUM 8.4*   No results for input(s): "LABPT", "INR" in the last 72 hours.  Sensation intact distally Intact pulses distally Dorsiflexion/Plantar flexion intact Incision: dressing C/D/I   Assessment/Plan: 1 Day Post-Op Procedure(s) (LRB): RIGHT TOTAL HIP ARTHROPLASTY ANTERIOR APPROACH (Right) Up with therapy Discharge home with home health      Theresa Nelson 05/09/2022, 7:55 AM

## 2022-05-09 NOTE — Progress Notes (Signed)
Physical Therapy Treatment Patient Details Name: Theresa Nelson MRN: [id number] DOB: [birth year] Today's Date: 05/09/2022   History of Present Illness Patient is a 52 y/o female who presents for right THA, direct anterior approach 05/08/22.    PT Comments    Pt was received in supine and agreeable to session reporting desire to get up and walk. Pt reported dizziness and sweating during gait trial and was able to walk to the other side of the bed for a seated recovery break. BP was taken and noted to indicate orthostatic symptoms. After sitting for ~5 mins, pt was able to complete another short gait trial to sit in recliner with pt reporting increased dizziness and sweating again. Deferred further gait due to symptoms. Pt reported not eating breakfast yet, so plan to revisit later this morning to perform further gait trial to ensure safe discharge home. Pt continues to benefit from PT services to progress toward functional mobility goals.     Recommendations for follow up therapy are one component of a multi-disciplinary discharge planning process, led by the attending physician.  Recommendations may be updated based on patient status, additional functional criteria and insurance authorization.  Follow Up Recommendations  Follow physician's recommendations for discharge plan and follow up therapies     Assistance Recommended at Discharge PRN  Patient can return home with the following Assist for transportation;Assistance with cooking/housework;A little help with walking and/or transfers;A little help with bathing/dressing/bathroom   Equipment Recommendations  None recommended by PT    Recommendations for Other Services       Precautions / Restrictions Precautions Precautions: Fall Restrictions Weight Bearing Restrictions: Yes RLE Weight Bearing: Weight bearing as tolerated     Mobility  Bed Mobility Overal bed mobility: Modified Independent             General bed mobility  comments: instruction in use of gait belt for RLE progression to EOB    Transfers Overall transfer level: Needs assistance Equipment used: Rolling walker (2 wheels) Transfers: Sit to/from Stand Sit to Stand: Min assist, Min guard           General transfer comment: Initially min A from EOB to RW due to R hip pain progressing to min guard    Ambulation/Gait Ambulation/Gait assistance: Min guard Gait Distance (Feet): 30 Feet Assistive device: Rolling walker (2 wheels) Gait Pattern/deviations: Step-to pattern, Antalgic, Trunk flexed, Decreased stance time - right, Decreased step length - left Gait velocity: decreased   Pre-gait activities: Weight shifting at EOB General Gait Details: Slow, antalgic gait with cues for sequencing with RW to reduce pain. Close min guard due to pt reporting shooting pain in R hip during WB. Limited distance due to orthostatic symptoms.       Balance Overall balance assessment: Needs assistance Sitting-balance support: Feet supported, No upper extremity supported Sitting balance-Leahy Scale: Good Sitting balance - Comments: at EOB   Standing balance support: During functional activity, Bilateral upper extremity supported, Reliant on assistive device for balance Standing balance-Leahy Scale: Poor Standing balance comment: RW support                            Cognition Arousal/Alertness: Awake/alert   Overall Cognitive Status: Within Functional Limits for tasks assessed  Exercises Total Joint Exercises Ankle Circles/Pumps: AROM, Both, 10 reps, Supine Quad Sets: AROM, Both, Seated, 5 reps Short Arc Quad: AROM, Right, 5 reps, Supine Heel Slides: Supine, Right, 5 reps, AAROM Hip ABduction/ADduction: AROM, Supine, Right, 5 reps Long Arc Quad: Seated, Right, 5 reps, AAROM    General Comments General comments (skin integrity, edema, etc.): Pt reported dizziness and sweating  upon standing.  Sitting BP after short gait trial: 78/49; Sitting BP after 3 mins: 94/59; Sitting BP at end of session 85/50. Deferred further gait trial due to symptoms.      Pertinent Vitals/Pain Pain Assessment Pain Assessment: 0-10 Pain Score: 5  Pain Location: right hip Pain Descriptors / Indicators: Sore, Operative site guarding, Shooting Pain Intervention(s): Limited activity within patient's tolerance, Monitored during session, Repositioned     PT Goals (current goals can now be found in the care plan section) Acute Rehab PT Goals Patient Stated Goal: to play with my grandkids PT Goal Formulation: With patient Time For Goal Achievement: 05/22/22 Potential to Achieve Goals: Good Progress towards PT goals: Progressing toward goals    Frequency    7X/week      PT Plan         AM-PAC PT "6 Clicks" Mobility   Outcome Measure  Help needed turning from your back to your side while in a flat bed without using bedrails?: None Help needed moving from lying on your back to sitting on the side of a flat bed without using bedrails?: None Help needed moving to and from a bed to a chair (including a wheelchair)?: A Little Help needed standing up from a chair using your arms (e.g., wheelchair or bedside chair)?: A Little Help needed to walk in hospital room?: A Little Help needed climbing 3-5 steps with a railing? : A Little 6 Click Score: 20    End of Session Equipment Utilized During Treatment: Gait belt Activity Tolerance: Treatment limited secondary to medical complications (Comment) (dizziness) Patient left: in chair;with call bell/phone within reach;with family/visitor present Nurse Communication: Mobility status PT Visit Diagnosis: Pain;Difficulty in walking, not elsewhere classified (R26.2) Pain - Right/Left: Right Pain - part of body: Hip     Time: [id number] PT Time Calculation (min) (ACUTE ONLY): 35 min  Charges:  $Gait Training: 8-22 mins $Therapeutic  Exercise: 8-22 mins                     Michelle Nasuti, PTA Acute Rehabilitation Services Secure Chat Preferred  [phone]    Michelle Nasuti 05/09/2022, 9:14 AM

## 2022-05-11 ENCOUNTER — Telehealth: Payer: Self-pay | Admitting: Orthopaedic Surgery

## 2022-05-11 NOTE — Telephone Encounter (Signed)
[employer] forms received. To [employer].

## 2022-05-14 ENCOUNTER — Telehealth: Payer: Self-pay | Admitting: *Deleted

## 2022-05-14 NOTE — Telephone Encounter (Signed)
Received VM from patient today stating she was discharged from hospital last week after hip replacement and she has not heard anything from [place]. Looked at hospital notes at discharge and noted that [facility] made referral to [facility] and they could not accept. Attempted to call patient to see if she wanted to be set up with OPPT since it's been a week and she did not answer. No way to leave VM. Will re-try later today.

## 2022-05-17 ENCOUNTER — Encounter: Payer: Self-pay | Admitting: Radiology

## 2022-05-21 ENCOUNTER — Ambulatory Visit (INDEPENDENT_AMBULATORY_CARE_PROVIDER_SITE_OTHER): Payer: BC Managed Care – PPO | Admitting: Orthopaedic Surgery

## 2022-05-21 ENCOUNTER — Telehealth: Payer: Self-pay | Admitting: Orthopaedic Surgery

## 2022-05-21 ENCOUNTER — Encounter: Payer: Self-pay | Admitting: Orthopaedic Surgery

## 2022-05-21 DIAGNOSIS — Z96641 Presence of right artificial hip joint: Secondary | ICD-10-CM

## 2022-05-21 NOTE — Progress Notes (Signed)
The patient is 2 weeks status post a right total hip arthroplasty.  She is only 52 years old.  She is ambulate with a walker and says she is doing well.  She actually stopped her aspirin 2 days ago.  She denies any calf pain or foot and ankle swelling.  On exam her right hip is stiff to be expected from the surgery itself.  She has been dealing with some right knee pain and hopefully this will calm down.  There are some numbness around her incision.  Her incision looks great.  I placed new Steri-Strips.  She will continue to increase her activities as comfort allows.  She is already off of pain medication.  She can drop from my standpoint.  Will likely keep her out of work the next 6 to 8 weeks or more depending on her recovery.  We will reevaluate her in 4 weeks.  She will stay out of work until then minimum.

## 2022-05-21 NOTE — Telephone Encounter (Signed)
Received auth and $25.00 check for [employer] disability from patient. To [employer].

## 2022-05-24 ENCOUNTER — Encounter: Payer: Self-pay | Admitting: Radiology

## 2022-06-25 ENCOUNTER — Encounter: Payer: Self-pay | Admitting: Orthopaedic Surgery

## 2022-06-25 ENCOUNTER — Ambulatory Visit (INDEPENDENT_AMBULATORY_CARE_PROVIDER_SITE_OTHER): Payer: BC Managed Care – PPO | Admitting: Orthopaedic Surgery

## 2022-06-25 DIAGNOSIS — Z96641 Presence of right artificial hip joint: Secondary | ICD-10-CM

## 2022-06-25 MED ORDER — METHOCARBAMOL 500 MG PO TABS: 500.0000 mg | ORAL_TABLET | Freq: Four times a day (QID) | ORAL | 1 refills | Status: AC | PRN

## 2022-06-25 MED ORDER — METHYLPREDNISOLONE 4 MG PO TABS: ORAL_TABLET | ORAL | 0 refills | Status: AC

## 2022-06-25 NOTE — Progress Notes (Signed)
The patient is only 52 years old and is very active and thin.  She is now 7 weeks status post a right total hip replacement.  Her biggest complaint though is pain over her right quad tendon at the knee.  That is really what is bothering her the most with weightbearing.  There is pain over the quad tendon.  This is not the right knee.  There is no knee joint effusion and her extensor mechanism is intact.  I would like to her to try Voltaren gel twice daily over this area where she is hurting.  I will also send in a steroid taper.  We will see her back in 4 weeks for continued follow-up as a relates to her right hip replacement and this is before she is to return to work.  If she is still having knee issues in 4 weeks from now we can have an AP and lateral of her right knee.

## 2022-07-23 ENCOUNTER — Other Ambulatory Visit (INDEPENDENT_AMBULATORY_CARE_PROVIDER_SITE_OTHER): Payer: BC Managed Care – PPO

## 2022-07-23 ENCOUNTER — Encounter: Payer: Self-pay | Admitting: Orthopaedic Surgery

## 2022-07-23 ENCOUNTER — Ambulatory Visit (INDEPENDENT_AMBULATORY_CARE_PROVIDER_SITE_OTHER): Payer: BC Managed Care – PPO | Admitting: Orthopaedic Surgery

## 2022-07-23 DIAGNOSIS — G8929 Other chronic pain: Secondary | ICD-10-CM

## 2022-07-23 DIAGNOSIS — M25561 Pain in right knee: Secondary | ICD-10-CM

## 2022-07-23 NOTE — Progress Notes (Signed)
The patient is now 3 months status post a right total knee arthroplasty.  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  She did want [place] to x-ray her right knee today.  It feels much better since the time of surgery and prednisone certainly helped.  She reports improved range of motion and strength overall and the knee does feel better as well.  On exam her right knee shows no effusion with full range of motion.  Her right operative hip moves smoothly and fluidly with just a little bit of tightness and pain.  2 views of the right knee show no acute findings.  The joint spaces well-maintained.  This did give her reassurance.  She can resume work duty starting Jul 30, 2022 with no restrictions.  If she does have any issues she knows to let us know.  The next time we need to see her is in 3 months.  Will have a standing low AP pelvis lateral of her right operative hip at that visit.

## 2022-10-24 ENCOUNTER — Ambulatory Visit: Payer: BC Managed Care – PPO | Admitting: Orthopaedic Surgery

## 2022-10-29 ENCOUNTER — Ambulatory Visit: Payer: BC Managed Care – PPO | Admitting: Orthopaedic Surgery

## 2022-11-12 ENCOUNTER — Ambulatory Visit (INDEPENDENT_AMBULATORY_CARE_PROVIDER_SITE_OTHER): Payer: BC Managed Care – PPO | Admitting: Orthopaedic Surgery

## 2022-11-12 ENCOUNTER — Other Ambulatory Visit (INDEPENDENT_AMBULATORY_CARE_PROVIDER_SITE_OTHER): Payer: BC Managed Care – PPO

## 2022-11-12 ENCOUNTER — Encounter: Payer: Self-pay | Admitting: Orthopaedic Surgery

## 2022-11-12 DIAGNOSIS — Z96641 Presence of right artificial hip joint: Secondary | ICD-10-CM

## 2022-11-12 NOTE — Progress Notes (Signed)
The patient is now 6 months status post a right total hip arthroplasty.  She is only 52 and unfortunately had debilitating arthritis involving her right hip.  She is doing well overall and has no issues.  She says that has helped her knee pain on that right side.  She denies any issues with her left hip either.  She is a thin and active individual.  She walks with a normal-appearing gait.  Her right operative hip moves smoothly and fluidly.  Her left native hip also moves smoothly and fluidly.  Standing AP pelvis and lateral of her right hip shows a well-seated total hip arthroplasty with bone ingrowth.  Her right hip arthroplasty looks good in terms of offset and leg length.  Her left negative hip still has a well-maintained joint space.  This point she can follow-up as needed for her right hip.  If she does develop any issues at all she knows to reach out and let us know.

## 2023-07-19 ENCOUNTER — Encounter: Payer: Self-pay | Admitting: Radiology
